# Patient Record
Sex: Male | Born: 1980
Health system: Southern US, Community
[De-identification: ages and names within clinical notes are randomized; demographics above are authoritative.]

## PROBLEM LIST (undated history)

## (undated) HISTORY — PX: VARICOSE VEIN SURGERY: SHX832

---

## 2006-06-28 ENCOUNTER — Emergency Department: Payer: Self-pay | Admitting: Emergency Medicine

## 2010-06-01 ENCOUNTER — Ambulatory Visit: Payer: Self-pay | Admitting: Internal Medicine

## 2010-06-08 ENCOUNTER — Ambulatory Visit: Payer: Self-pay | Admitting: Internal Medicine

## 2011-03-22 ENCOUNTER — Ambulatory Visit: Payer: Self-pay

## 2013-05-30 DIAGNOSIS — N469 Male infertility, unspecified: Secondary | ICD-10-CM | POA: Insufficient documentation

## 2015-01-09 ENCOUNTER — Ambulatory Visit (INDEPENDENT_AMBULATORY_CARE_PROVIDER_SITE_OTHER): Payer: Commercial Indemnity | Admitting: Family Medicine

## 2015-01-09 ENCOUNTER — Encounter: Payer: Self-pay | Admitting: Family Medicine

## 2015-01-09 VITALS — BP 120/80 | HR 80 | Resp 12 | Ht 76.0 in

## 2015-01-09 DIAGNOSIS — J0100 Acute maxillary sinusitis, unspecified: Secondary | ICD-10-CM

## 2015-01-09 DIAGNOSIS — H6983 Other specified disorders of Eustachian tube, bilateral: Secondary | ICD-10-CM

## 2015-01-09 MED ORDER — GUAIFENESIN-CODEINE 100-10 MG/5ML PO SOLN: 5.0000 mL | Freq: Three times a day (TID) | ORAL | Status: DC | PRN

## 2015-01-09 MED ORDER — AMOXICILLIN 500 MG PO CAPS: 500.0000 mg | ORAL_CAPSULE | Freq: Three times a day (TID) | ORAL | Status: DC

## 2015-01-09 NOTE — Progress Notes (Signed)
Name: Juan Gregory   MRN: [id number]    DOB: [birth year]   Date:01/09/2015       Progress Note  Subjective  Chief Complaint  Chief Complaint  Patient presents with  . Sinusitis    cough and cong- drainage in back of throat making him cough especially in am- productive cough    Sinusitis This is a new problem. The current episode started in the past 7 days. The problem has been gradually worsening since onset. There has been no fever. His pain is at a severity of 3/10. The pain is mild. Associated symptoms include congestion, coughing, diaphoresis, ear pain, headaches, sinus pressure, a sore throat and swollen glands. Pertinent negatives include no chills, hoarse voice, neck pain, shortness of breath or sneezing. Past treatments include oral decongestants. The treatment provided no relief.    No problem-specific assessment & plan notes found for this encounter.   No past medical history on file.  Past Surgical History  Procedure Laterality Date  . Varicose vein surgery Bilateral     No family history on file.  Social History   Social History  . Marital Status: Married    Spouse Name: N/A  . Number of Children: N/A  . Years of Education: N/A   Occupational History  . Not on file.   Social History Main Topics  . Smoking status: Never Smoker   . Smokeless tobacco: Current User    Types: Snuff  . Alcohol Use: 0.0 oz/week    0 Standard drinks or equivalent per week  . Drug Use: No  . Sexual Activity: Not on file   Other Topics Concern  . Not on file   Social History Narrative  . No narrative on file    No Known Allergies   Review of Systems  Constitutional: Positive for diaphoresis. Negative for fever, chills, weight loss and malaise/fatigue.  HENT: Positive for congestion, ear pain, sinus pressure and sore throat. Negative for ear discharge, hoarse voice and sneezing.   Eyes: Negative for blurred vision.  Respiratory: Positive for cough. Negative for  sputum production, shortness of breath and wheezing.   Cardiovascular: Negative for chest pain, palpitations and leg swelling.  Gastrointestinal: Negative for heartburn, nausea, abdominal pain, diarrhea, constipation, blood in stool and melena.  Genitourinary: Negative for dysuria, urgency, frequency and hematuria.  Musculoskeletal: Negative for myalgias, back pain, joint pain and neck pain.  Skin: Negative for rash.  Neurological: Positive for headaches. Negative for dizziness, tingling, sensory change and focal weakness.  Endo/Heme/Allergies: Negative for environmental allergies and polydipsia. Does not bruise/bleed easily.  Psychiatric/Behavioral: Negative for depression and suicidal ideas. The patient is not nervous/anxious and does not have insomnia.      Objective  Filed Vitals:   01/09/15 1409  BP: 120/80  Pulse: 80  Resp: 12  Height: 6\' 4"  (1.93 m)    Physical Exam  Constitutional: He is oriented to person, place, and time and well-developed, well-nourished, and in no distress.  HENT:  Head: Normocephalic.  Right Ear: External ear normal. Tympanic membrane is retracted.  Left Ear: External ear normal. Tympanic membrane is retracted.  Nose: Nose normal.  Mouth/Throat: Posterior oropharyngeal erythema present.  Eyes: Conjunctivae and EOM are normal. Pupils are equal, round, and reactive to light. Right eye exhibits no discharge. Left eye exhibits no discharge. No scleral icterus.  Neck: Normal range of motion. Neck supple. No JVD present. No tracheal deviation present. No thyromegaly present.  Cardiovascular: Normal rate, regular rhythm, normal heart sounds  and intact distal pulses.  Exam reveals no gallop and no friction rub.   No murmur heard. Pulmonary/Chest: Breath sounds normal. No respiratory distress. He has no wheezes. He has no rales.  Abdominal: Soft. Bowel sounds are normal. He exhibits no mass. There is no hepatosplenomegaly. There is no tenderness. There is no  rebound, no guarding and no CVA tenderness.  Musculoskeletal: Normal range of motion. He exhibits no edema or tenderness.  Lymphadenopathy:       Head (right side): Submandibular adenopathy present.       Head (left side): Submandibular adenopathy present.    He has no cervical adenopathy.  Neurological: He is alert and oriented to person, place, and time. He has normal sensation, normal strength, normal reflexes and intact cranial nerves. No cranial nerve deficit.  Skin: Skin is warm. No rash noted.  Psychiatric: Mood and affect normal.      Assessment & Plan  Problem List Items Addressed This Visit    None    Visit Diagnoses    Acute maxillary sinusitis, recurrence not specified    -  Primary    Relevant Medications    amoxicillin (AMOXIL) 500 MG capsule    guaiFENesin-codeine 100-10 MG/5ML syrup    Eustachian tube dysfunction, bilateral        decongestant/steroid nasal         Dr. [facility]  01/09/2015

## 2016-04-03 ENCOUNTER — Emergency Department
Admission: EM | Admit: 2016-04-03 | Discharge: 2016-04-03 | Disposition: A | Discharge status: Home / Self Care | Payer: Managed Care, Other (non HMO) | Attending: Emergency Medicine | Admitting: Emergency Medicine

## 2016-04-03 ENCOUNTER — Encounter: Payer: Self-pay | Admitting: Emergency Medicine

## 2016-04-03 DIAGNOSIS — J069 Acute upper respiratory infection, unspecified: Secondary | ICD-10-CM | POA: Diagnosis not present

## 2016-04-03 DIAGNOSIS — B9789 Other viral agents as the cause of diseases classified elsewhere: Secondary | ICD-10-CM

## 2016-04-03 DIAGNOSIS — Z79899 Other long term (current) drug therapy: Secondary | ICD-10-CM | POA: Diagnosis not present

## 2016-04-03 DIAGNOSIS — F17290 Nicotine dependence, other tobacco product, uncomplicated: Secondary | ICD-10-CM | POA: Insufficient documentation

## 2016-04-03 DIAGNOSIS — R05 Cough: Secondary | ICD-10-CM | POA: Diagnosis present

## 2016-04-03 LAB — INFLUENZA PANEL BY PCR (TYPE A & B)
INFLAPCR: NEGATIVE
Influenza B By PCR: NEGATIVE

## 2016-04-03 MED ORDER — FLUTICASONE PROPIONATE 50 MCG/ACT NA SUSP: 2.0000 | Freq: Every day | NASAL | 0 refills | Status: DC

## 2016-04-03 MED ORDER — PSEUDOEPH-BROMPHEN-DM 30-2-10 MG/5ML PO SYRP: 10.0000 mL | ORAL_SOLUTION | Freq: Four times a day (QID) | ORAL | 0 refills | Status: DC | PRN

## 2016-04-03 NOTE — ED Provider Notes (Signed)
[facility] Emergency Department Provider Note  ____________________________________________  Time seen: Approximately 5:41 PM  I have reviewed the triage vital signs and the nursing notes.   HISTORY  Chief Complaint wants flu test    HPI Juan Gregory is a 36 y.o. male , NAD, presents to the emergency department with2 day history of cough, chest congestion and body aches. States he has had fever all with the highest temperature being 65F. Has taken some Mucinex without significant relief of symptoms. Denies any sick contacts. Has had no sinus pressure, ear pain, ear pressure, sore throat, drainage from ears, nasal congestion, runny nose. Denies shortness breath, wheezing. Has had no abdominal pain, nausea, vomiting or diarrhea.   History reviewed. No pertinent past medical history.  There are no active problems to display for this patient.   Past Surgical History:  Procedure Laterality Date  . VARICOSE VEIN SURGERY Bilateral     Prior to Admission medications   Medication Sig Start Date End Date Taking? Authorizing Provider  amoxicillin (AMOXIL) 500 MG capsule Take 1 capsule (500 mg total) by mouth 3 (three) times daily. 01/09/15   Juline Patch, MD  brompheniramine-pseudoephedrine-DM 30-2-10 MG/5ML syrup Take 10 mLs by mouth 4 (four) times daily as needed. 04/03/16   Destinee Taber L Emaree Chiu, PA-C  fluticasone (FLONASE) 50 MCG/ACT nasal spray Place 2 sprays into both nostrils daily. 04/03/16   Tamea Bai L Airik Goodlin, PA-C  guaiFENesin-codeine 100-10 MG/5ML syrup Take 5 mLs by mouth 3 (three) times daily as needed. 01/09/15   Juline Patch, MD    Allergies Patient has no known allergies.  History reviewed. No pertinent family history.  Social History Social History  Substance Use Topics  . Smoking status: Never Smoker  . Smokeless tobacco: Current User    Types: Snuff  . Alcohol use 0.0 oz/week     Review of Systems  Constitutional: Positive fever with  MAXIMUM TEMPERATURE of 65F and fatigue. No chills, rigors ENT: No sore throat, Nasal congestion, runny nose, ear pain, ear pressure, sinus pressure. Cardiovascular: No chest pain. Respiratory: Positive cough, chest congestion. No shortness of breath. No wheezing.  Gastrointestinal: No abdominal pain.  No nausea, vomiting.  No diarrhea.   Musculoskeletal: Positive for general myalgias.  Skin: Negative for rash. Neurological: Negative for headaches. 10-point ROS otherwise negative.  ____________________________________________   PHYSICAL EXAM:  VITAL SIGNS: ED Triage Vitals [04/03/16 1630]  Enc Vitals Group     BP 123/67     Pulse Rate (!) 112     Resp 18     Temp 98.8 F (37.1 C)     Temp Source Oral     SpO2 97 %     Weight 235 lb (106.6 kg)     Height 6\' 4"  (1.93 m)     Head Circumference      Peak Flow      Pain Score 7     Pain Loc      Pain Edu?      Excl. in [place]?      Constitutional: Alert and oriented. Well appearing and in no acute distress. Eyes: Conjunctivae are normal.  Head: Atraumatic. ENT:      Ears: TMs visualized bilaterally with out effusion, bulging, erythema, perforation.      Nose: Mild congestion with trace clear rhinorrhea. Bilateral turbinates are injected.      Mouth/Throat: Mucous membranes are moist. Nares without erythema, swelling, exudate. Uvula is midline. Airways pain. Clear postnasal drip. Neck: Upper  with full range of motion. Hematological/Lymphatic/Immunilogical: No cervical lymphadenopathy. Cardiovascular: Normal rate, regular rhythm. Normal S1 and S2.  Good peripheral circulation. Respiratory: Normal respiratory effort without tachypnea or retractions. Lungs CTAB with breath sounds noted in all lung fields. No wheeze, rhonchi, rales Neurologic:  Normal speech and language. No gross focal neurologic deficits are appreciated.  Skin:  Skin is warm, dry and intact. No rash noted. Psychiatric: Mood and affect are normal. Speech and  behavior are normal. Patient exhibits appropriate insight and judgement.   ____________________________________________   LABS (all labs ordered are listed, but only abnormal results are displayed)  Labs Reviewed  INFLUENZA PANEL BY PCR (TYPE A & B)   ____________________________________________  EKG  None ____________________________________________  RADIOLOGY  None ____________________________________________    PROCEDURES  Procedure(s) performed: None   Procedures   Medications - No data to display   ____________________________________________   INITIAL IMPRESSION / ASSESSMENT AND PLAN / ED COURSE  Pertinent labs & imaging results that were available during my care of the patient were reviewed by me and considered in my medical decision making (see chart for details).     Patient's diagnosis is consistent with viral URI with cough. Patient will be discharged home with prescriptions for Bromfed-DM, Flonase to take as directed. Patient is to follow up with his primary care provider if symptoms persist past this treatment course. Patient is given ED precautions to return to the ED for any worsening or new symptoms.    ____________________________________________  FINAL CLINICAL IMPRESSION(S) / ED DIAGNOSES  Final diagnoses:  Viral URI with cough      NEW MEDICATIONS STARTED DURING THIS VISIT:  Discharge Medication List as of 04/03/2016  6:42 PM    START taking these medications   Details  brompheniramine-pseudoephedrine-DM 30-2-10 MG/5ML syrup Take 10 mLs by mouth 4 (four) times daily as needed., Starting Thu 04/03/2016, Print    fluticasone (FLONASE) 50 MCG/ACT nasal spray Place 2 sprays into both nostrils daily., Starting Thu 04/03/2016, [place], PA-C 04/03/16 [place], MD 04/03/16 2007

## 2016-04-03 NOTE — ED Notes (Signed)
States cough, congestion, fever for several days, states he wants to be tested for the flu, awake and alert in no acute distress

## 2016-04-03 NOTE — ED Triage Notes (Signed)
Pt c/o body aches, congestion, and cough since yesterday. Wants to be checked for the flu, unsure if exposed. Highest temp 99.6. No distress, ambulatory to triage.

## 2016-06-23 ENCOUNTER — Encounter: Payer: Self-pay | Admitting: Family Medicine

## 2016-06-23 ENCOUNTER — Ambulatory Visit (INDEPENDENT_AMBULATORY_CARE_PROVIDER_SITE_OTHER): Payer: Managed Care, Other (non HMO) | Admitting: Family Medicine

## 2016-06-23 VITALS — BP 120/70 | HR 80 | Ht 76.0 in | Wt 234.0 lb

## 2016-06-23 DIAGNOSIS — J0100 Acute maxillary sinusitis, unspecified: Secondary | ICD-10-CM | POA: Diagnosis not present

## 2016-06-23 MED ORDER — AMOXICILLIN 500 MG PO CAPS: 500.0000 mg | ORAL_CAPSULE | Freq: Three times a day (TID) | ORAL | 1 refills | Status: DC

## 2016-06-23 MED ORDER — GUAIFENESIN-CODEINE 100-10 MG/5ML PO SYRP: 5.0000 mL | ORAL_SOLUTION | Freq: Three times a day (TID) | ORAL | 0 refills | Status: DC | PRN

## 2016-06-23 NOTE — Progress Notes (Signed)
Name: Juan Gregory   MRN: [id number]    DOB: 06-30-1980   Date:06/23/2016       Progress Note  Subjective  Chief Complaint  Chief Complaint  Patient presents with  . Sinusitis    cough with small amount of clear mucus. Feels like ear is irritated    Sinusitis  This is a new problem. The current episode started in the past 7 days. The problem has been gradually worsening since onset. There has been no fever. Associated symptoms include congestion, coughing, ear pain, sinus pressure and sneezing. Pertinent negatives include no chills, diaphoresis, headaches, hoarse voice, neck pain, shortness of breath, sore throat or swollen glands. Past treatments include oral decongestants. The treatment provided no relief (mucinex dm).    No problem-specific Assessment & Plan notes found for this encounter.   No past medical history on file.  Past Surgical History:  Procedure Laterality Date  . VARICOSE VEIN SURGERY Bilateral     No family history on file.  Social History   Social History  . Marital status: Married    Spouse name: N/A  . Number of children: N/A  . Years of education: N/A   Occupational History  . Not on file.   Social History Main Topics  . Smoking status: Never Smoker  . Smokeless tobacco: Current User    Types: Snuff  . Alcohol use 0.0 oz/week  . Drug use: No  . Sexual activity: Not on file   Other Topics Concern  . Not on file   Social History Narrative  . No narrative on file    No Known Allergies  Outpatient Medications Prior to Visit  Medication Sig Dispense Refill  . fluticasone (FLONASE) 50 MCG/ACT nasal spray Place 2 sprays into both nostrils daily. 16 g 0  . amoxicillin (AMOXIL) 500 MG capsule Take 1 capsule (500 mg total) by mouth 3 (three) times daily. 30 capsule 0  . brompheniramine-pseudoephedrine-DM 30-2-10 MG/5ML syrup Take 10 mLs by mouth 4 (four) times daily as needed. 200 mL 0  . guaiFENesin-codeine 100-10 MG/5ML syrup Take 5 mLs by  mouth 3 (three) times daily as needed. 120 mL 0   No facility-administered medications prior to visit.     Review of Systems  Constitutional: Negative for chills, diaphoresis, fever, malaise/fatigue and weight loss.  HENT: Positive for congestion, ear pain, sinus pressure and sneezing. Negative for ear discharge, hoarse voice and sore throat.   Eyes: Negative for blurred vision.  Respiratory: Positive for cough. Negative for sputum production, shortness of breath and wheezing.   Cardiovascular: Negative for chest pain, palpitations and leg swelling.  Gastrointestinal: Negative for abdominal pain, blood in stool, constipation, diarrhea, heartburn, melena and nausea.  Genitourinary: Negative for dysuria, frequency, hematuria and urgency.  Musculoskeletal: Negative for back pain, joint pain, myalgias and neck pain.  Skin: Negative for rash.  Neurological: Negative for dizziness, tingling, sensory change, focal weakness and headaches.  Endo/Heme/Allergies: Negative for environmental allergies and polydipsia. Does not bruise/bleed easily.  Psychiatric/Behavioral: Negative for depression and suicidal ideas. The patient is not nervous/anxious and does not have insomnia.      Objective  Vitals:   06/23/16 1502  BP: 120/70  Pulse: 80  Weight: 234 lb (106.1 kg)  Height: 6\' 4"  (1.93 m)    Physical Exam  Constitutional: He is oriented to person, place, and time and well-developed, well-nourished, and in no distress.  HENT:  Head: Normocephalic.  Right Ear: External ear normal.  Left Ear: External ear  normal.  Nose: Nose normal.  Mouth/Throat: Oropharynx is clear and moist.  Eyes: Conjunctivae and EOM are normal. Pupils are equal, round, and reactive to light. Right eye exhibits no discharge. Left eye exhibits no discharge. No scleral icterus.  Neck: Normal range of motion. Neck supple. No JVD present. No tracheal deviation present. No thyromegaly present.  Cardiovascular: Normal rate,  regular rhythm, normal heart sounds and intact distal pulses.  Exam reveals no gallop and no friction rub.   No murmur heard. Pulmonary/Chest: Breath sounds normal. No respiratory distress. He has no wheezes. He has no rales.  Abdominal: Soft. Bowel sounds are normal. He exhibits no mass. There is no hepatosplenomegaly. There is no tenderness. There is no rebound, no guarding and no CVA tenderness.  Musculoskeletal: Normal range of motion. He exhibits no edema or tenderness.  Lymphadenopathy:    He has no cervical adenopathy.  Neurological: He is alert and oriented to person, place, and time. He has normal sensation, normal strength, normal reflexes and intact cranial nerves. No cranial nerve deficit.  Skin: Skin is warm. No rash noted.  Psychiatric: Mood and affect normal.  Nursing note and vitals reviewed.     Assessment & Plan  Problem List Items Addressed This Visit    None    Visit Diagnoses    Acute maxillary sinusitis, recurrence not specified    -  Primary   Relevant Medications   amoxicillin (AMOXIL) 500 MG capsule   guaiFENesin-codeine (ROBITUSSIN AC) 100-10 MG/5ML syrup      Meds ordered this encounter  Medications  . amoxicillin (AMOXIL) 500 MG capsule    Sig: Take 1 capsule (500 mg total) by mouth 3 (three) times daily.    Dispense:  30 capsule    Refill:  1  . guaiFENesin-codeine (ROBITUSSIN AC) 100-10 MG/5ML syrup    Sig: Take 5 mLs by mouth 3 (three) times daily as needed for cough.    Dispense:  150 mL    Refill:  0      Dr. Aquiles [facility]  06/23/16

## 2016-07-04 ENCOUNTER — Ambulatory Visit (INDEPENDENT_AMBULATORY_CARE_PROVIDER_SITE_OTHER): Payer: Managed Care, Other (non HMO) | Admitting: Family Medicine

## 2016-07-04 ENCOUNTER — Encounter: Payer: Self-pay | Admitting: Family Medicine

## 2016-07-04 VITALS — BP 130/70 | HR 78 | Ht 76.0 in | Wt 231.0 lb

## 2016-07-04 DIAGNOSIS — J301 Allergic rhinitis due to pollen: Secondary | ICD-10-CM

## 2016-07-04 DIAGNOSIS — Z23 Encounter for immunization: Secondary | ICD-10-CM

## 2016-07-04 DIAGNOSIS — Z0000 Encounter for general adult medical examination without abnormal findings: Secondary | ICD-10-CM

## 2016-07-04 LAB — POCT URINALYSIS DIPSTICK
Bilirubin, UA: NEGATIVE
Blood, UA: NEGATIVE
GLUCOSE UA: NEGATIVE
KETONES UA: NEGATIVE
LEUKOCYTES UA: NEGATIVE
Nitrite, UA: NEGATIVE
PROTEIN UA: NEGATIVE
Spec Grav, UA: 1.01 (ref 1.010–1.025)
UROBILINOGEN UA: 0.2 U/dL
pH, UA: 5 (ref 5.0–8.0)

## 2016-07-04 MED ORDER — FLUTICASONE PROPIONATE 50 MCG/ACT NA SUSP: 2.0000 | Freq: Every day | NASAL | 11 refills | Status: DC

## 2016-07-04 MED ORDER — FEXOFENADINE-PSEUDOEPHED ER 180-240 MG PO TB24: 1.0000 | ORAL_TABLET | Freq: Every day | ORAL | 11 refills | Status: DC

## 2016-07-04 NOTE — Progress Notes (Signed)
Name: Juan Gregory   MRN: [id number]    DOB: [birth year]   Date:07/04/2016       Progress Note  Subjective  Chief Complaint  Chief Complaint  Patient presents with  . Annual Exam    no issues or concerns    Patient presents for annual physical exam.    No problem-specific Assessment & Plan notes found for this encounter.   No past medical history on file.  Past Surgical History:  Procedure Laterality Date  . VARICOSE VEIN SURGERY Bilateral     No family history on file.  Social History   Social History  . Marital status: Married    Spouse name: N/A  . Number of children: N/A  . Years of education: N/A   Occupational History  . Not on file.   Social History Main Topics  . Smoking status: Never Smoker  . Smokeless tobacco: Current User    Types: Snuff  . Alcohol use 0.0 oz/week  . Drug use: No  . Sexual activity: Not on file   Other Topics Concern  . Not on file   Social History Narrative  . No narrative on file    No Known Allergies  Outpatient Medications Prior to Visit  Medication Sig Dispense Refill  . guaiFENesin-codeine (ROBITUSSIN AC) 100-10 MG/5ML syrup Take 5 mLs by mouth 3 (three) times daily as needed for cough. 150 mL 0  . fluticasone (FLONASE) 50 MCG/ACT nasal spray Place 2 sprays into both nostrils daily. 16 g 0  . amoxicillin (AMOXIL) 500 MG capsule Take 1 capsule (500 mg total) by mouth 3 (three) times daily. 30 capsule 1   No facility-administered medications prior to visit.     Review of Systems  Constitutional: Negative for chills, fever, malaise/fatigue and weight loss.  HENT: Negative for ear discharge, ear pain and sore throat.   Eyes: Negative for blurred vision.  Respiratory: Negative for cough, sputum production, shortness of breath and wheezing.   Cardiovascular: Negative for chest pain, palpitations and leg swelling.  Gastrointestinal: Negative for abdominal pain, blood in stool, constipation, diarrhea, heartburn,  melena and nausea.  Genitourinary: Negative for dysuria, frequency, hematuria and urgency.  Musculoskeletal: Negative for back pain, joint pain, myalgias and neck pain.  Skin: Negative for rash.  Neurological: Negative for dizziness, tingling, sensory change, focal weakness and headaches.  Endo/Heme/Allergies: Negative for environmental allergies and polydipsia. Does not bruise/bleed easily.  Psychiatric/Behavioral: Negative for depression and suicidal ideas. The patient is not nervous/anxious and does not have insomnia.      Objective  Vitals:   07/04/16 0828  BP: 130/70  Pulse: 78  Weight: 231 lb (104.8 kg)  Height: 6\' 4"  (1.93 m)    Physical Exam  Constitutional: He is oriented to person, place, and time and well-developed, well-nourished, and in no distress.  HENT:  Head: Normocephalic.  Right Ear: Tympanic membrane, external ear and ear canal normal.  Left Ear: Tympanic membrane, external ear and ear canal normal.  Nose: Nose normal.  Mouth/Throat: Uvula is midline, oropharynx is clear and moist and mucous membranes are normal.  Eyes: Conjunctivae, EOM and lids are normal. Pupils are equal, round, and reactive to light. Right eye exhibits no discharge. Left eye exhibits no discharge. No scleral icterus.  Neck: Normal range of motion. Neck supple. Normal carotid pulses, no hepatojugular reflux and no JVD present. Carotid bruit is not present. No tracheal deviation present. No thyromegaly present.  Cardiovascular: Normal rate, regular rhythm, S1 normal, S2 normal, normal  heart sounds, intact distal pulses and normal pulses.  PMI is not displaced.  Exam reveals no gallop, no S3, no S4, no friction rub and no decreased pulses.   No murmur heard. Pulmonary/Chest: Effort normal and breath sounds normal. No respiratory distress. He has no wheezes. He has no rales. Right breast exhibits no mass. Left breast exhibits no mass.  Abdominal: Soft. Normal aorta and bowel sounds are normal. He  exhibits no mass. There is no hepatosplenomegaly, splenomegaly or hepatomegaly. There is no tenderness. There is no rebound, no guarding and no CVA tenderness.  Genitourinary: Prostate normal, testes/scrotum normal and penis normal. Rectal exam shows no external hemorrhoid and guaiac negative stool.  Musculoskeletal: Normal range of motion. He exhibits no edema or tenderness.       Cervical back: Normal.       Thoracic back: Normal.       Lumbar back: Normal.  Lymphadenopathy:       Head (right side): No submental and no submandibular adenopathy present.       Head (left side): No submental and no submandibular adenopathy present.    He has no cervical adenopathy.    He has no axillary adenopathy.  Neurological: He is alert and oriented to person, place, and time. He has normal sensation, normal strength, normal reflexes and intact cranial nerves. No cranial nerve deficit.  Skin: Skin is warm and intact. No rash noted. He is not diaphoretic. No pallor.  Psychiatric: Mood, memory, affect and judgment normal.  Nursing note and vitals reviewed.     Assessment & Plan  Problem List Items Addressed This Visit    None    Visit Diagnoses    Annual physical exam    -  Primary   Relevant Orders   Renal Function Panel   Lipid Profile   Seasonal allergic rhinitis due to pollen       Relevant Medications   fexofenadine-pseudoephedrine (ALLEGRA-D 24) 180-240 MG 24 hr tablet   fluticasone (FLONASE) 50 MCG/ACT nasal spray   Need for diphtheria-tetanus-pertussis (Tdap) vaccine       Relevant Orders   POCT urinalysis dipstick (Completed)   Tdap vaccine greater than or equal to 7yo IM (Completed)      Meds ordered this encounter  Medications  . fexofenadine-pseudoephedrine (ALLEGRA-D 24) 180-240 MG 24 hr tablet    Sig: Take 1 tablet by mouth daily.    Dispense:  30 tablet    Refill:  11  . fluticasone (FLONASE) 50 MCG/ACT nasal spray    Sig: Place 2 sprays into both nostrils daily.     Dispense:  16 g    Refill:  11      Dr. [facility]  07/04/16

## 2016-07-08 ENCOUNTER — Other Ambulatory Visit (INDEPENDENT_AMBULATORY_CARE_PROVIDER_SITE_OTHER): Payer: Managed Care, Other (non HMO)

## 2016-07-08 DIAGNOSIS — Z1211 Encounter for screening for malignant neoplasm of colon: Secondary | ICD-10-CM | POA: Diagnosis not present

## 2016-07-08 LAB — HEMOCCULT GUIAC POC 1CARD (OFFICE)
Card #2 Fecal Occult Blod, POC: NEGATIVE
FECAL OCCULT BLD: NEGATIVE

## 2017-11-24 ENCOUNTER — Emergency Department: Payer: Managed Care, Other (non HMO)

## 2017-11-24 ENCOUNTER — Other Ambulatory Visit: Payer: Self-pay

## 2017-11-24 ENCOUNTER — Emergency Department
Admission: EM | Admit: 2017-11-24 | Discharge: 2017-11-24 | Disposition: A | Discharge status: Home / Self Care | Payer: Managed Care, Other (non HMO) | Attending: Emergency Medicine | Admitting: Emergency Medicine

## 2017-11-24 DIAGNOSIS — R109 Unspecified abdominal pain: Secondary | ICD-10-CM | POA: Diagnosis present

## 2017-11-24 DIAGNOSIS — N201 Calculus of ureter: Secondary | ICD-10-CM | POA: Diagnosis not present

## 2017-11-24 DIAGNOSIS — R300 Dysuria: Secondary | ICD-10-CM | POA: Diagnosis not present

## 2017-11-24 DIAGNOSIS — N23 Unspecified renal colic: Secondary | ICD-10-CM | POA: Diagnosis not present

## 2017-11-24 DIAGNOSIS — R112 Nausea with vomiting, unspecified: Secondary | ICD-10-CM | POA: Insufficient documentation

## 2017-11-24 LAB — CBC
HEMATOCRIT: 42.2 % (ref 40.0–52.0)
Hemoglobin: 15 g/dL (ref 13.0–18.0)
MCH: 34.3 pg — ABNORMAL HIGH (ref 26.0–34.0)
MCHC: 35.4 g/dL (ref 32.0–36.0)
MCV: 96.8 fL (ref 80.0–100.0)
Platelets: 157 10*3/uL (ref 150–440)
RBC: 4.36 MIL/uL — AB (ref 4.40–5.90)
RDW: 12.6 % (ref 11.5–14.5)
WBC: 12.5 10*3/uL — ABNORMAL HIGH (ref 3.8–10.6)

## 2017-11-24 LAB — COMPREHENSIVE METABOLIC PANEL
ALT: 26 U/L (ref 0–44)
AST: 27 U/L (ref 15–41)
Albumin: 4.4 g/dL (ref 3.5–5.0)
Alkaline Phosphatase: 51 U/L (ref 38–126)
Anion gap: 8 (ref 5–15)
BILIRUBIN TOTAL: 0.6 mg/dL (ref 0.3–1.2)
BUN: 14 mg/dL (ref 6–20)
CO2: 25 mmol/L (ref 22–32)
Calcium: 8.9 mg/dL (ref 8.9–10.3)
Chloride: 106 mmol/L (ref 98–111)
Creatinine, Ser: 1.05 mg/dL (ref 0.61–1.24)
Glucose, Bld: 156 mg/dL — ABNORMAL HIGH (ref 70–99)
POTASSIUM: 3.7 mmol/L (ref 3.5–5.1)
Sodium: 139 mmol/L (ref 135–145)
TOTAL PROTEIN: 7.1 g/dL (ref 6.5–8.1)

## 2017-11-24 LAB — URINALYSIS, COMPLETE (UACMP) WITH MICROSCOPIC
BACTERIA UA: NONE SEEN
BILIRUBIN URINE: NEGATIVE
Glucose, UA: NEGATIVE mg/dL
Ketones, ur: NEGATIVE mg/dL
LEUKOCYTES UA: NEGATIVE
NITRITE: NEGATIVE
Protein, ur: 30 mg/dL — AB
RBC / HPF: >50 RBC/hpf — ABNORMAL HIGH (ref 0–5)
SPECIFIC GRAVITY, URINE: 1.017 (ref 1.005–1.030)
Squamous Epithelial / LPF: NONE SEEN (ref 0–5)
pH: 7 (ref 5.0–8.0)

## 2017-11-24 LAB — LIPASE, BLOOD: Lipase: 104 U/L — ABNORMAL HIGH (ref 11–51)

## 2017-11-24 MED ORDER — OXYCODONE-ACETAMINOPHEN 5-325 MG PO TABS: 1.0000 | ORAL_TABLET | Freq: Four times a day (QID) | ORAL | 0 refills | Status: DC | PRN

## 2017-11-24 MED ORDER — TAMSULOSIN HCL 0.4 MG PO CAPS: ORAL_CAPSULE | ORAL | 0 refills | Status: DC

## 2017-11-24 MED ORDER — KETOROLAC TROMETHAMINE 30 MG/ML IJ SOLN
15.0000 mg | Freq: Once | INTRAMUSCULAR | Status: AC
  Administered 2017-11-24: 15 mg via INTRAVENOUS
  Filled 2017-11-24: qty 1

## 2017-11-24 MED ORDER — MORPHINE SULFATE (PF) 4 MG/ML IV SOLN
4.0000 mg | Freq: Once | INTRAVENOUS | Status: AC
  Administered 2017-11-24: 4 mg via INTRAVENOUS
  Filled 2017-11-24: qty 1

## 2017-11-24 MED ORDER — SODIUM CHLORIDE 0.9 % IV BOLUS
1000.0000 mL | Freq: Once | INTRAVENOUS | Status: AC
  Administered 2017-11-24: 1000 mL via INTRAVENOUS

## 2017-11-24 MED ORDER — DOCUSATE SODIUM 100 MG PO CAPS: ORAL_CAPSULE | ORAL | 0 refills | Status: DC

## 2017-11-24 MED ORDER — ONDANSETRON HCL 4 MG/2ML IJ SOLN
4.0000 mg | INTRAMUSCULAR | Status: AC
  Administered 2017-11-24: 4 mg via INTRAVENOUS
  Filled 2017-11-24: qty 2

## 2017-11-24 MED ORDER — ONDANSETRON 4 MG PO TBDP: ORAL_TABLET | ORAL | 0 refills | Status: DC

## 2017-11-24 NOTE — ED Provider Notes (Signed)
[facility] Emergency Department Provider Note  ____________________________________________   First MD Initiated Contact with Patient 11/24/17 [phone]     (approximate)  I have reviewed the triage vital signs and the nursing notes.   HISTORY  Chief Complaint Abdominal Pain and Dysuria    HPI Juan Gregory is a 37 y.o. male with no chronic medical issues who presents for evaluation of acute onset left-sided abdominal and flank pain as well as burning urination and having a difficult time urinating.  The symptoms started about 2 hours prior to arrival to the ED. they were severe and nothing in particular made them better or worse although he is feeling a little bit better now.  He has no history of kidney stones.  He denies fever/chills, chest pain or shortness of breath.  He has had multiple episodes of vomiting and persistent nausea.  No past medical history on file.  There are no active problems to display for this patient.   Past Surgical History:  Procedure Laterality Date  . VARICOSE VEIN SURGERY Bilateral     Prior to Admission medications   Medication Sig Start Date End Date Taking? Authorizing Provider  docusate sodium (COLACE) 100 MG capsule Take 1 tablet once or twice daily as needed for constipation while taking narcotic pain medicine 11/24/17   Hinda Kehr, MD  fexofenadine-pseudoephedrine (ALLEGRA-D 24) 180-240 MG 24 hr tablet Take 1 tablet by mouth daily. 07/04/16   Juline Patch, MD  fluticasone (FLONASE) 50 MCG/ACT nasal spray Place 2 sprays into both nostrils daily. 07/04/16   Juline Patch, MD  guaiFENesin-codeine (ROBITUSSIN AC) 100-10 MG/5ML syrup Take 5 mLs by mouth 3 (three) times daily as needed for cough. 06/23/16   Juline Patch, MD  ondansetron (ZOFRAN ODT) 4 MG disintegrating tablet Allow 1-2 tablets to dissolve in your mouth every 8 hours as needed for nausea/vomiting 11/24/17   Hinda Kehr, MD  oxyCODONE-acetaminophen  (PERCOCET) 5-325 MG tablet Take 1-2 tablets by mouth every 6 (six) hours as needed for severe pain. 11/24/17   Hinda Kehr, MD  tamsulosin (FLOMAX) 0.4 MG CAPS capsule Take 1 tablet by mouth daily until you pass the kidney stone or no longer have symptoms 11/24/17   Hinda Kehr, MD    Allergies Patient has no known allergies.  No family history on file.  Social History Social History   Tobacco Use  . Smoking status: Never Smoker  . Smokeless tobacco: Current User    Types: Snuff  Substance Use Topics  . Alcohol use: Yes    Alcohol/week: 0.0 standard drinks  . Drug use: No    Review of Systems Constitutional: No fever/chills Eyes: No visual changes. ENT: No sore throat. Cardiovascular: Denies chest pain. Respiratory: Denies shortness of breath. Gastrointestinal: Abdominal pain, nausea, and vomiting as described above. Genitourinary: Dysuria and urinary hesitancy as described above Musculoskeletal: Negative for neck pain.  Left flank pain Integumentary: Negative for rash. Neurological: Negative for headaches, focal weakness or numbness.   ____________________________________________   PHYSICAL EXAM:  VITAL SIGNS: ED Triage Vitals  Enc Vitals Group     BP 11/24/17 0208 112/71     Pulse Rate 11/24/17 0208 (!) 59     Resp 11/24/17 0208 18     Temp 11/24/17 0208 (!) 97.5 F (36.4 C)     Temp Source 11/24/17 0208 Oral     SpO2 11/24/17 0208 97 %     Weight 11/24/17 0209 104.3 kg (230 lb)  Height 11/24/17 0209 1.93 m (6\' 4" )     Head Circumference --      Peak Flow --      Pain Score 11/24/17 0224 8     Pain Loc --      Pain Edu? --      Excl. in [place]? --     Constitutional: Alert and oriented. Well appearing and in no acute distress after morphine but was in severe distress before Eyes: Conjunctivae are normal.  Head: Atraumatic. Nose: No congestion/rhinnorhea. Mouth/Throat: Mucous membranes are moist. Neck: No stridor.  No meningeal signs.     Cardiovascular: Normal rate, regular rhythm. Good peripheral circulation. Grossly normal heart sounds. Respiratory: Normal respiratory effort.  No retractions. Lungs CTAB. Gastrointestinal: Soft and nontender. No distention.  Genitourinary: Normal-appearing circumcised male external genitalia.  No swelling, discoloration, nor tenderness in the left inguinal region in the area indicated as an abnormality on the CT scan.  Testicles are nontender to palpation and no scrotal edema. Musculoskeletal: No lower extremity tenderness nor edema. No gross deformities of extremities.  Mild left CVA tenderness to percussion. Neurologic:  Normal speech and language. No gross focal neurologic deficits are appreciated.  Skin:  Skin is warm, dry and intact. No rash noted. Psychiatric: Mood and affect are normal. Speech and behavior are normal.  ____________________________________________   LABS (all labs ordered are listed, but only abnormal results are displayed)  Labs Reviewed  LIPASE, BLOOD - Abnormal; Notable for the following components:      Result Value   Lipase 104 (*)    All other components within normal limits  COMPREHENSIVE METABOLIC PANEL - Abnormal; Notable for the following components:   Glucose, Bld 156 (*)    All other components within normal limits  CBC - Abnormal; Notable for the following components:   WBC 12.5 (*)    RBC 4.36 (*)    MCH 34.3 (*)    All other components within normal limits  URINALYSIS, COMPLETE (UACMP) WITH MICROSCOPIC - Abnormal; Notable for the following components:   Color, Urine YELLOW (*)    APPearance HAZY (*)    Hgb urine dipstick LARGE (*)    Protein, ur 30 (*)    RBC / HPF >50 (*)    All other components within normal limits   ____________________________________________  EKG  None - EKG not ordered by ED physician ____________________________________________  RADIOLOGY   ED MD interpretation: Obstructing left 3 mm UVJ stone.  Question of  vascular or other irregularity in the left inguinal region.  Official radiology report(s): Ct Renal Stone Study  Result Date: 11/24/2017 CLINICAL DATA:  Left flank pain and painful urination.  Vomiting. EXAM: CT ABDOMEN AND PELVIS WITHOUT CONTRAST TECHNIQUE: Multidetector CT imaging of the abdomen and pelvis was performed following the standard protocol without IV contrast. COMPARISON:  None. FINDINGS: Lower chest: Mild elevation of right hemidiaphragm. Dependent atelectasis in both lungs. No pleural fluid or consolidation. Hepatobiliary: Subcentimeter hypodensity in the right hepatic lobe, too small and incompletely characterized on noncontrast exam. Gallbladder physiologically distended, no calcified stone. No biliary dilatation. Pancreas: No ductal dilatation or inflammation. Spleen: Normal in size without focal abnormality. Adrenals/Urinary Tract: Normal adrenal glands. Obstructing 3 mm stone in the distal left ureter just proximal to the ureterovesicular junction with moderate left hydroureteronephrosis. Mild left perinephric edema. No right hydronephrosis or hydroureter. No right perinephric edema. Within the mid left kidney is an 18 mm lesion slightly hyperdense to adjacent renal parenchyma. Urinary bladder near completely empty.  No bladder wall thickening. Stomach/Bowel: Stomach is physiologically distended. No bowel wall thickening, inflammatory change or obstruction. Normal appendix. Slight fecalization of distal small bowel contents. Vascular/Lymphatic: Serpiginous vessels/soft tissue density in the left inguinal region appear contiguous with the left iliac vein. Mild stranding in the retroperitoneum is nonspecific. No enlarged abdominal or pelvic lymph nodes. Few multiple prominent central mesenteric and ileocolic nodes likely reactive. Reproductive: Prostate is unremarkable. Other: Fat in both inguinal canals. Small fat containing umbilical hernia. Tiny fat containing ventral abdominal wall  supraumbilical hernia. No ascites or free air. Musculoskeletal: Vacuum phenomena at L5-S1. There are no acute or suspicious osseous abnormalities. IMPRESSION: 1. Obstructing 3 mm stone in the distal left ureter just proximal to the ureterovesicular junction with moderate hydronephrosis. 2. Clustered serpiginous soft tissue density in the left inguinal region likely vascular, may be varicosities but are nonspecific and suboptimally assessed in the absence of IV contrast. Recommend correlation with physical exam. Electronically Signed   By: Keith Rake M.D.   On: 11/24/2017 03:24  With the morning  ____________________________________________   PROCEDURES  Critical Care performed: No   Procedure(s) performed:   Procedures   ____________________________________________   INITIAL IMPRESSION / ASSESSMENT AND PLAN / ED COURSE  As part of my medical decision making, I reviewed the following data within the [place] notes reviewed and incorporated, Labs reviewed  and Notes from prior ED visits    Differential diagnosis includes, but is not limited to, renal colic, UTI, infected/obstructive stone, and pyelonephritis, musculoskeletal pain.  Patient has some hematuria but no sign of urinary tract infection.  He has a leukocytosis which is likely reactive.  Vital signs are stable and appropriate.  Lipase is slightly elevated but this is likely the results of multiple episodes of vomiting given that the patient has no tenderness to palpation of his upper abdomen and no other signs or symptoms or concerns for pancreatitis.  Metabolic panel is within normal limits.  Patient is feeling much better after morphine 4 mg IV and Zofran 4 mg IV. Also gave him 1L NS IV bolus.  I had my usual customary kidney stone discussion with the patient and he understands and agrees with the plan for outpatient follow-up.  I provided medications as listed below.  He is in no acute distress at  the time of discharge.      ____________________________________________  FINAL CLINICAL IMPRESSION(S) / ED DIAGNOSES  Final diagnoses:  Left ureteral stone  Renal colic  Non-intractable vomiting with nausea, unspecified vomiting type     MEDICATIONS GIVEN DURING THIS VISIT:  Medications  morphine 4 MG/ML injection 4 mg (4 mg Intravenous Given 11/24/17 0324)  ondansetron (ZOFRAN) injection 4 mg (4 mg Intravenous Given 11/24/17 0324)  sodium chloride 0.9 % bolus 1,000 mL (0 mLs Intravenous Stopped 11/24/17 0431)  ketorolac (TORADOL) 30 MG/ML injection 15 mg (15 mg Intravenous Given 11/24/17 0356)     ED Discharge Orders         Ordered    oxyCODONE-acetaminophen (PERCOCET) 5-325 MG tablet  Every 6 hours PRN     11/24/17 0424    ondansetron (ZOFRAN ODT) 4 MG disintegrating tablet     11/24/17 0424    tamsulosin (FLOMAX) 0.4 MG CAPS capsule     11/24/17 0424    docusate sodium (COLACE) 100 MG capsule     11/24/17 0424           Note:  This document was prepared using Dragon voice recognition  software and may include unintentional dictation errors.    Hinda Kehr, MD 11/24/17 [phone]

## 2017-11-24 NOTE — ED Notes (Signed)
Pt reports left lower abdominal pain that started around midnight. Pt also states vomiting twice at home and twice here in ED. Pt is alert and oriented x 4. Family at bedside.

## 2017-11-24 NOTE — Discharge Instructions (Signed)
You have been seen in the Emergency Department (ED) today for pain that we believe based on your workup, is caused by kidney stones.  As we have discussed, please drink plenty of fluids.  Please make a follow up appointment with the physician(s) listed elsewhere in this documentation. ° °You may take pain medication as needed but ONLY as prescribed.  Please also take your prescribed Flomax daily.  We also recommend that you take over-the-counter ibuprofen regularly according to label instructions over the next 5 days.  Take it with meals to minimize stomach discomfort. ° °Please see your doctor as soon as possible as stones may take 1-3 weeks to pass and you may require additional care or medications. ° °Do not drink alcohol, drive or participate in any other potentially dangerous activities while taking opiate pain medication as it may make you sleepy. Do not take this medication with any other sedating medications, either prescription or over-the-counter. If you were prescribed Percocet or Vicodin, do not take these with acetaminophen (Tylenol) as it is already contained within these medications. °  °Take Percocet as needed for severe pain.  This medication is an opiate (or narcotic) pain medication and can be habit forming.  Use it as little as possible to achieve adequate pain control.  Do not use or use it with extreme caution if you have a history of opiate abuse or dependence.  If you are on a pain contract with your primary care doctor or a pain specialist, be sure to let them know you were prescribed this medication today from the [facility].  This medication is intended for your use only - do not give any to anyone else and keep it in a secure place where nobody else, especially children, have access to it.  It will also cause or worsen constipation, so you may want to consider taking an over-the-counter stool softener while you are taking this medication. ° °Return to the  Emergency Department (ED) or call your doctor if you have any worsening pain, fever, painful urination, are unable to urinate, or develop other symptoms that concern you. ° °

## 2017-11-24 NOTE — ED Triage Notes (Signed)
Pt presents to ED with left lower abd pain and painful urination since around 0030. Pt states he feels pressure like he needs to urinate but "doesn't really have to go". Vomiting X3. Actively vomiting in triage.

## 2017-12-02 ENCOUNTER — Ambulatory Visit (INDEPENDENT_AMBULATORY_CARE_PROVIDER_SITE_OTHER): Payer: Managed Care, Other (non HMO) | Admitting: Family Medicine

## 2017-12-02 ENCOUNTER — Encounter: Payer: Self-pay | Admitting: Family Medicine

## 2017-12-02 VITALS — BP 118/80 | HR 80 | Ht 76.0 in | Wt 228.0 lb

## 2017-12-02 DIAGNOSIS — N200 Calculus of kidney: Secondary | ICD-10-CM

## 2017-12-02 DIAGNOSIS — I861 Scrotal varices: Secondary | ICD-10-CM | POA: Diagnosis not present

## 2017-12-02 DIAGNOSIS — N342 Other urethritis: Secondary | ICD-10-CM | POA: Diagnosis not present

## 2017-12-02 LAB — POCT URINALYSIS DIPSTICK

## 2017-12-02 MED ORDER — DOXYCYCLINE HYCLATE 100 MG PO TABS: 100.0000 mg | ORAL_TABLET | Freq: Two times a day (BID) | ORAL | 0 refills | Status: DC

## 2017-12-02 NOTE — Progress Notes (Signed)
Name: Juan Gregory   MRN: [id number]    DOB: 1980/05/16   Date:12/02/2017       Progress Note  Subjective  Chief Complaint  Chief Complaint  Patient presents with  . Follow-up    went to ER on Sept 10- didn't keep him- obstructing 37mm stone    Urinary Frequency   This is a new problem. The current episode started in the past 7 days. The problem occurs intermittently. The problem has been waxing and waning. The quality of the pain is described as burning. The pain is mild. There has been no fever. The fever has been present for less than 1 day. Associated symptoms include frequency. Pertinent negatives include no chills, discharge, flank pain, hematuria, hesitancy, nausea, sweats, urgency or vomiting. He has tried acetaminophen for the symptoms. The treatment provided moderate relief. His past medical history is significant for kidney stones. There is no history of catheterization, recurrent UTIs, a single kidney, urinary stasis or a urological procedure.    No problem-specific Assessment & Plan notes found for this encounter.   History reviewed. No pertinent past medical history.  Past Surgical History:  Procedure Laterality Date  . VARICOSE VEIN SURGERY Bilateral     History reviewed. No pertinent family history.  Social History   Socioeconomic History  . Marital status: Married    Spouse name: Not on file  . Number of children: Not on file  . Years of education: Not on file  . Highest education level: Not on file  Occupational History  . Not on file  Social Needs  . Financial resource strain: Not on file  . Food insecurity:    Worry: Not on file    Inability: Not on file  . Transportation needs:    Medical: Not on file    Non-medical: Not on file  Tobacco Use  . Smoking status: Never Smoker  . Smokeless tobacco: Current User    Types: Snuff  Substance and Sexual Activity  . Alcohol use: Yes    Alcohol/week: 0.0 standard drinks  . Drug use: No  . Sexual  activity: Not on file  Lifestyle  . Physical activity:    Days per week: Not on file    Minutes per session: Not on file  . Stress: Not on file  Relationships  . Social connections:    Talks on phone: Not on file    Gets together: Not on file    Attends religious service: Not on file    Active member of club or organization: Not on file    Attends meetings of clubs or organizations: Not on file    Relationship status: Not on file  . Intimate partner violence:    Fear of current or ex partner: Not on file    Emotionally abused: Not on file    Physically abused: Not on file    Forced sexual activity: Not on file  Other Topics Concern  . Not on file  Social History Narrative  . Not on file    No Known Allergies  Outpatient Medications Prior to Visit  Medication Sig Dispense Refill  . tamsulosin (FLOMAX) 0.4 MG CAPS capsule Take 1 tablet by mouth daily until you pass the kidney stone or no longer have symptoms 14 capsule 0  . oxyCODONE-acetaminophen (PERCOCET) 5-325 MG tablet Take 1-2 tablets by mouth every 6 (six) hours as needed for severe pain. (Patient not taking: Reported on 12/02/2017) 15 tablet 0  . docusate sodium (COLACE)  100 MG capsule Take 1 tablet once or twice daily as needed for constipation while taking narcotic pain medicine 30 capsule 0  . fexofenadine-pseudoephedrine (ALLEGRA-D 24) 180-240 MG 24 hr tablet Take 1 tablet by mouth daily. 30 tablet 11  . fluticasone (FLONASE) 50 MCG/ACT nasal spray Place 2 sprays into both nostrils daily. 16 g 11  . guaiFENesin-codeine (ROBITUSSIN AC) 100-10 MG/5ML syrup Take 5 mLs by mouth 3 (three) times daily as needed for cough. 150 mL 0  . ondansetron (ZOFRAN ODT) 4 MG disintegrating tablet Allow 1-2 tablets to dissolve in your mouth every 8 hours as needed for nausea/vomiting 30 tablet 0   No facility-administered medications prior to visit.     Review of Systems  Constitutional: Negative for chills, fever, malaise/fatigue and  weight loss.  HENT: Negative for ear discharge, ear pain and sore throat.   Eyes: Negative for blurred vision.  Respiratory: Negative for cough, sputum production, shortness of breath and wheezing.   Cardiovascular: Negative for chest pain, palpitations and leg swelling.  Gastrointestinal: Negative for abdominal pain, blood in stool, constipation, diarrhea, heartburn, melena, nausea and vomiting.  Genitourinary: Positive for frequency. Negative for dysuria, flank pain, hematuria, hesitancy and urgency.  Musculoskeletal: Negative for back pain, joint pain, myalgias and neck pain.  Skin: Negative for rash.  Neurological: Negative for dizziness, tingling, sensory change, focal weakness and headaches.  Endo/Heme/Allergies: Negative for environmental allergies and polydipsia. Does not bruise/bleed easily.  Psychiatric/Behavioral: Negative for depression and suicidal ideas. The patient is not nervous/anxious and does not have insomnia.      Objective  Vitals:   12/02/17 0802  BP: 118/80  Pulse: 80  Weight: 228 lb (103.4 kg)  Height: 6\' 4"  (1.93 m)    Physical Exam  Constitutional: He is oriented to person, place, and time.  HENT:  Head: Normocephalic.  Right Ear: External ear normal.  Left Ear: External ear normal.  Nose: Nose normal.  Mouth/Throat: Oropharynx is clear and moist.  Eyes: Pupils are equal, round, and reactive to light. Conjunctivae and EOM are normal. Right eye exhibits no discharge. Left eye exhibits no discharge. No scleral icterus.  Neck: Normal range of motion. Neck supple. No JVD present. No tracheal deviation present. No thyromegaly present.  Cardiovascular: Normal rate, regular rhythm, normal heart sounds and intact distal pulses. Exam reveals no gallop and no friction rub.  No murmur heard. Pulmonary/Chest: Breath sounds normal. No respiratory distress. He has no wheezes. He has no rales.  Abdominal: Soft. Bowel sounds are normal. He exhibits no mass. There is  no hepatosplenomegaly. There is no tenderness. There is no rebound, no guarding and no CVA tenderness.  Genitourinary: Penis normal. Right testis shows no mass, no swelling and no tenderness. Right testis is descended. Left testis shows no mass, no swelling and no tenderness. Left testis is descended.  Genitourinary Comments: Right varicocoel  Musculoskeletal: Normal range of motion. He exhibits no edema or tenderness.  Lymphadenopathy:    He has no cervical adenopathy.  Neurological: He is alert and oriented to person, place, and time. He has normal strength and normal reflexes. No cranial nerve deficit.  Skin: Skin is warm. No rash noted.  Nursing note and vitals reviewed.     Assessment & Plan  Problem List Items Addressed This Visit    None    Visit Diagnoses    Nephrolithiasis    -  Primary   Flank pain resolved next day and likely paased stone(no strain provided). May stop talmulosin.  Will recheck urine in 1 week after doxy.   Varicocele       Right scrotal area examined and varicocoel confirmed.   Urethritis       Patient took AZO/unable to read dipstick. will treat with doxycycline 100 bid for 7 days and recheck urine.   Relevant Medications   doxycycline (VIBRA-TABS) 100 MG tablet   Other Relevant Orders   POCT Urinalysis Dipstick (Completed)      Meds ordered this encounter  Medications  . doxycycline (VIBRA-TABS) 100 MG tablet    Sig: Take 1 tablet (100 mg total) by mouth 2 (two) times daily.    Dispense:  14 tablet    Refill:  0      Dr. Otilio Miu [facility]  12/02/17

## 2017-12-11 ENCOUNTER — Other Ambulatory Visit (INDEPENDENT_AMBULATORY_CARE_PROVIDER_SITE_OTHER): Payer: Managed Care, Other (non HMO)

## 2017-12-11 DIAGNOSIS — N342 Other urethritis: Secondary | ICD-10-CM | POA: Diagnosis not present

## 2017-12-11 LAB — POCT URINALYSIS DIPSTICK
BILIRUBIN UA: NEGATIVE
Blood, UA: NEGATIVE
GLUCOSE UA: NEGATIVE
Ketones, UA: NEGATIVE
LEUKOCYTES UA: NEGATIVE
Nitrite, UA: NEGATIVE
Protein, UA: NEGATIVE
Spec Grav, UA: 1.02 (ref 1.010–1.025)
Urobilinogen, UA: 0.2 E.U./dL
pH, UA: 7 (ref 5.0–8.0)

## 2017-12-14 ENCOUNTER — Encounter: Payer: Self-pay | Admitting: Family Medicine

## 2017-12-14 ENCOUNTER — Ambulatory Visit (INDEPENDENT_AMBULATORY_CARE_PROVIDER_SITE_OTHER): Payer: Managed Care, Other (non HMO) | Admitting: Family Medicine

## 2017-12-14 VITALS — BP 120/78 | HR 75 | Temp 98.0°F | Ht 76.0 in | Wt 228.0 lb

## 2017-12-14 DIAGNOSIS — J40 Bronchitis, not specified as acute or chronic: Secondary | ICD-10-CM | POA: Diagnosis not present

## 2017-12-14 DIAGNOSIS — J0100 Acute maxillary sinusitis, unspecified: Secondary | ICD-10-CM

## 2017-12-14 MED ORDER — AZITHROMYCIN 250 MG PO TABS: ORAL_TABLET | ORAL | 0 refills | Status: DC

## 2017-12-14 MED ORDER — GUAIFENESIN-CODEINE 100-10 MG/5ML PO SYRP: 5.0000 mL | ORAL_SOLUTION | Freq: Three times a day (TID) | ORAL | 0 refills | Status: DC | PRN

## 2017-12-14 NOTE — Progress Notes (Signed)
Date:  12/14/2017   Name:  Juan Gregory   DOB:  07/07/1980   MRN:  [id number]   Chief Complaint: Cough (Drainage into throat causing sore throat. Started Last Weds. ) Sinusitis  This is a new problem. The current episode started in the past 7 days (Wednesday). The problem has been gradually worsening since onset. There has been no fever. His pain is at a severity of 3/10. Associated symptoms include congestion, coughing, ear pain, a hoarse voice, shortness of breath, sinus pressure, sneezing and a sore throat. Pertinent negatives include no chills, diaphoresis, headaches, neck pain or swollen glands. (Prod clear cough/bilat ears congested/) Past treatments include acetaminophen and oral decongestants. The treatment provided mild relief.     Review of Systems  Constitutional: Negative for chills, diaphoresis and fever.  HENT: Positive for congestion, ear pain, hoarse voice, postnasal drip, rhinorrhea, sinus pressure, sinus pain, sneezing and sore throat. Negative for drooling, ear discharge and hearing loss.   Eyes: Negative for itching.  Respiratory: Positive for cough and shortness of breath. Negative for wheezing.   Cardiovascular: Negative for chest pain, palpitations and leg swelling.  Gastrointestinal: Negative for abdominal pain, blood in stool, constipation, diarrhea and nausea.  Endocrine: Negative for polydipsia.  Genitourinary: Negative for dysuria, frequency, hematuria and urgency.  Musculoskeletal: Negative for back pain, myalgias and neck pain.  Skin: Negative for rash.  Allergic/Immunologic: Negative for environmental allergies.  Neurological: Negative for dizziness and headaches.  Hematological: Does not bruise/bleed easily.  Psychiatric/Behavioral: Negative for suicidal ideas. The patient is not nervous/anxious.     There are no active problems to display for this patient.   No Known Allergies  Past Surgical History:  Procedure Laterality Date  . VARICOSE  VEIN SURGERY Bilateral     Social History   Tobacco Use  . Smoking status: Never Smoker  . Smokeless tobacco: Current User    Types: Snuff  Substance Use Topics  . Alcohol use: Yes    Alcohol/week: 0.0 standard drinks  . Drug use: No     Medication list has been reviewed and updated.  Current Meds  Medication Sig  . pseudoephedrine-acetaminophen (TYLENOL SINUS) 30-500 MG TABS tablet Take 1 tablet by mouth every 4 (four) hours as needed.    PHQ 2/9 Scores 12/02/2017  PHQ - 2 Score 0    Physical Exam  Constitutional: He is oriented to person, place, and time. He appears well-developed and well-nourished.  HENT:  Head: Normocephalic.  Right Ear: External ear normal.  Left Ear: Tympanic membrane, external ear and ear canal normal.  Nose: Nose normal. No mucosal edema. Right sinus exhibits no maxillary sinus tenderness and no frontal sinus tenderness. Left sinus exhibits no maxillary sinus tenderness and no frontal sinus tenderness.  Mouth/Throat: Posterior oropharyngeal erythema present. No oropharyngeal exudate or posterior oropharyngeal edema.  Cerumen impaction right  Eyes: Pupils are equal, round, and reactive to light. Conjunctivae and EOM are normal. Right eye exhibits no discharge. Left eye exhibits no discharge. No scleral icterus.  Neck: Normal range of motion. Neck supple. No JVD present. No tracheal deviation present. No thyromegaly present.  Cardiovascular: Normal rate, regular rhythm, normal heart sounds and intact distal pulses. Exam reveals no gallop and no friction rub.  No murmur heard. Pulmonary/Chest: Breath sounds normal. No respiratory distress. He has no wheezes. He has no rales.  Abdominal: Soft. Bowel sounds are normal. He exhibits no mass. There is no hepatosplenomegaly. There is no tenderness. There is no rebound, no  guarding and no CVA tenderness.  Musculoskeletal: Normal range of motion. He exhibits no edema or tenderness.  Lymphadenopathy:        Head (right side): No submandibular adenopathy present.       Head (left side): No submandibular adenopathy present.    He has no cervical adenopathy.       Right cervical: No superficial cervical adenopathy present.      Left cervical: No superficial cervical adenopathy present.  Neurological: He is alert and oriented to person, place, and time. He has normal strength and normal reflexes. No cranial nerve deficit.  Skin: Skin is warm. No rash noted.  Nursing note and vitals reviewed.   BP 120/78 (BP Location: Left Arm, Patient Position: Sitting, Cuff Size: Normal)   Pulse 75   Temp 98 F (36.7 C) (Oral)   Ht 6\' 4"  (1.93 m)   Wt 228 lb (103.4 kg)   SpO2 98%   BMI 27.75 kg/m   Assessment and Plan:  1. Acute non-recurrent maxillary sinusitis Acute Will initiate azithromycin 250 mg  - azithromycin (ZITHROMAX) 250 MG tablet; 2 today then 1 a day for 4 days  Dispense: 6 tablet; Refill: 0  2. Bronchitis Control sx with robitussin ac. - guaiFENesin-codeine (ROBITUSSIN AC) 100-10 MG/5ML syrup; Take 5 mLs by mouth 3 (three) times daily as needed for cough.  Dispense: 100 mL; Refill: 0   Dr. Otilio Miu [facility]  12/14/2017

## 2018-01-25 ENCOUNTER — Ambulatory Visit (INDEPENDENT_AMBULATORY_CARE_PROVIDER_SITE_OTHER): Payer: Managed Care, Other (non HMO) | Admitting: Family Medicine

## 2018-01-25 ENCOUNTER — Encounter: Payer: Self-pay | Admitting: Family Medicine

## 2018-01-25 VITALS — BP 120/80 | HR 80 | Ht 76.0 in | Wt 236.0 lb

## 2018-01-25 DIAGNOSIS — Z1211 Encounter for screening for malignant neoplasm of colon: Secondary | ICD-10-CM

## 2018-01-25 DIAGNOSIS — D225 Melanocytic nevi of trunk: Secondary | ICD-10-CM | POA: Diagnosis not present

## 2018-01-25 DIAGNOSIS — Z0000 Encounter for general adult medical examination without abnormal findings: Secondary | ICD-10-CM | POA: Diagnosis not present

## 2018-01-25 LAB — HEMOCCULT GUIAC POC 1CARD (OFFICE): FECAL OCCULT BLD: NEGATIVE

## 2018-01-25 NOTE — Progress Notes (Signed)
Date:  01/25/2018   Name:  Juan Gregory   DOB:  1981/01/22   MRN:  [id number]   Chief Complaint: Annual Exam (no concerns) Patient is a 37 year old male who presents for a comprehensive physical exam. The patient reports the following problems: none. Health maintenance has been reviewed refuses influenza.    Review of Systems  Constitutional: Negative for appetite change, chills, fatigue, fever and unexpected weight change.  HENT: Negative for drooling, ear discharge, ear pain, facial swelling, hearing loss, nosebleeds, sneezing, sore throat and trouble swallowing.   Eyes: Negative for photophobia, pain, discharge, redness, itching and visual disturbance.  Respiratory: Negative for cough, choking, chest tightness, shortness of breath and wheezing.   Cardiovascular: Negative for chest pain, palpitations and leg swelling.  Gastrointestinal: Negative for abdominal pain, blood in stool, constipation, diarrhea, nausea, rectal pain and vomiting.  Endocrine: Negative for cold intolerance, heat intolerance, polydipsia, polyphagia and polyuria.  Genitourinary: Negative for decreased urine volume, difficulty urinating, discharge, dysuria, flank pain, frequency, hematuria, penile pain, penile swelling, scrotal swelling, testicular pain and urgency.  Musculoskeletal: Negative for back pain, joint swelling, myalgias, neck pain and neck stiffness.  Skin: Negative for color change and rash.  Allergic/Immunologic: Negative for environmental allergies and immunocompromised state.  Neurological: Negative for dizziness, tremors, seizures, syncope, speech difficulty, weakness, light-headedness, numbness and headaches.  Hematological: Does not bruise/bleed easily.  Psychiatric/Behavioral: Negative for agitation, behavioral problems, confusion, dysphoric mood, hallucinations, self-injury and suicidal ideas. The patient is not nervous/anxious.     There are no active problems to display for this  patient.   No Known Allergies  Past Surgical History:  Procedure Laterality Date  . VARICOSE VEIN SURGERY Bilateral     Social History   Tobacco Use  . Smoking status: Never Smoker  . Smokeless tobacco: Current User    Types: Snuff  Substance Use Topics  . Alcohol use: Yes    Alcohol/week: 0.0 standard drinks  . Drug use: No     Medication list has been reviewed and updated.  No outpatient medications have been marked as taking for the 01/25/18 encounter (Office Visit) with Juline Patch, MD.    [facility] 2/9 Scores 01/25/2018 12/02/2017  PHQ - 2 Score 0 0  PHQ- 9 Score 1 -    Physical Exam  Constitutional: He is oriented to person, place, and time. Vital signs are normal. He appears well-developed and well-nourished.  HENT:  Head: Normocephalic.  Right Ear: Hearing, tympanic membrane, external ear and ear canal normal.  Left Ear: Hearing, tympanic membrane, external ear and ear canal normal.  Nose: Nose normal. No mucosal edema.  Mouth/Throat: Uvula is midline and oropharynx is clear and moist. No oropharyngeal exudate, posterior oropharyngeal edema, posterior oropharyngeal erythema or tonsillar abscesses.  Eyes: Pupils are equal, round, and reactive to light. Conjunctivae, EOM and lids are normal. Right eye exhibits no discharge. Left eye exhibits no discharge. No scleral icterus.  Fundoscopic exam:      The right eye shows no AV nicking.       The left eye shows no AV nicking.  Neck: Trachea normal and normal range of motion. Neck supple. Normal carotid pulses, no hepatojugular reflux and no JVD present. Carotid bruit is not present. No tracheal deviation present. No thyroid mass and no thyromegaly present.  Cardiovascular: Normal rate, regular rhythm, S1 normal, S2 normal, normal heart sounds and intact distal pulses. PMI is not displaced. Exam reveals no gallop, no S3, no S4 and  no friction rub.  No murmur heard.  No systolic murmur is present.  No diastolic murmur is  present. Pulses:      Carotid pulses are 2+ on the right side, and 2+ on the left side.      Radial pulses are 2+ on the right side, and 2+ on the left side.       Femoral pulses are 2+ on the right side, and 2+ on the left side.      Popliteal pulses are 2+ on the right side, and 2+ on the left side.       Dorsalis pedis pulses are 2+ on the right side, and 2+ on the left side.       Posterior tibial pulses are 2+ on the right side, and 2+ on the left side.  Pulmonary/Chest: Effort normal and breath sounds normal. No stridor. No respiratory distress. He has no decreased breath sounds. He has no wheezes. He has no rhonchi. He has no rales.  Abdominal: Soft. Normal appearance, normal aorta and bowel sounds are normal. He exhibits no mass. There is no hepatosplenomegaly. There is no tenderness. There is no rebound, no guarding and no CVA tenderness.  Genitourinary: Rectum normal, prostate normal, testes normal and penis normal. Rectal exam shows guaiac negative stool. Right testis shows no mass and no tenderness. Left testis shows no mass and no tenderness. Circumcised.  Genitourinary Comments: Right varicocoel  Musculoskeletal: Normal range of motion. He exhibits no edema or tenderness.  Feet:  Right Foot:  Skin Integrity: Negative for ulcer.  Left Foot:  Skin Integrity: Negative for ulcer.  Lymphadenopathy:       Head (right side): No submental and no submandibular adenopathy present.       Head (left side): No submental and no submandibular adenopathy present.    He has no cervical adenopathy.    He has no axillary adenopathy.  Neurological: He is alert and oriented to person, place, and time. He has normal strength and normal reflexes. No cranial nerve deficit or sensory deficit.  Reflex Scores:      Tricep reflexes are 2+ on the right side and 2+ on the left side.      Bicep reflexes are 2+ on the right side and 2+ on the left side.      Brachioradialis reflexes are 2+ on the right side  and 2+ on the left side.      Patellar reflexes are 2+ on the right side and 2+ on the left side.      Achilles reflexes are 2+ on the right side and 2+ on the left side. Skin: Skin is warm. Lesion noted. No rash noted.     Nursing note and vitals reviewed.   BP 120/80   Pulse 80   Ht 6\' 4"  (1.93 m)   Wt 236 lb (107 kg)   BMI 28.73 kg/m   Assessment and Plan:  1. Annual physical exam Juan Gregory is a 37 y.o. male who presents today for his Complete Annual Exam. He feels WELL. He He reports he is sleeping WELL/ draw renal, lipid/ guaiac negative - Renal Function Panel - Lipid panel - POCT occult blood stool  2. Melanocytic nevus of trunk Refer to derm for further eval - Ambulatory referral to Dermatology  3. Colon cancer screening Negative guaiac - POCT occult blood stool   Dr. Otilio Miu [facility]  01/25/2018

## 2018-11-15 ENCOUNTER — Ambulatory Visit (INDEPENDENT_AMBULATORY_CARE_PROVIDER_SITE_OTHER): Payer: Managed Care, Other (non HMO) | Admitting: Family Medicine

## 2018-11-15 ENCOUNTER — Other Ambulatory Visit: Payer: Self-pay

## 2018-11-15 ENCOUNTER — Encounter: Payer: Self-pay | Admitting: Family Medicine

## 2018-11-15 VITALS — BP 120/62 | HR 60 | Ht 76.0 in | Wt 225.0 lb

## 2018-11-15 DIAGNOSIS — I83813 Varicose veins of bilateral lower extremities with pain: Secondary | ICD-10-CM | POA: Diagnosis not present

## 2018-11-15 NOTE — Progress Notes (Signed)
Date:  11/15/2018   Name:  Juan Gregory   DOB:  Mar 09, 1981   MRN:  [id number]   Chief Complaint: Varicose Veins (L) leg is worse than R)- needs ref to V&V)  Patient is a 38 year old male who presents for a referral to vascular for varicose veins exam. The patient reports the following problems: varicosities. Health maintenance has been reviewed influenza in October. Leg Pain  The incident occurred more than 1 week ago. Injury mechanism: due to varicose veins. The pain is present in the left leg and right leg. Quality: tenderness. The pain has been constant since onset. Associated symptoms comments: Fatigue in legs. Treatments tried: vein surgery. The treatment provided moderate relief.    Review of Systems  Constitutional: Negative for chills and fever.  HENT: Negative for drooling, ear discharge, ear pain and sore throat.   Respiratory: Negative for cough, shortness of breath and wheezing.   Cardiovascular: Negative for chest pain, palpitations and leg swelling.  Gastrointestinal: Negative for abdominal pain, blood in stool, constipation, diarrhea and nausea.  Endocrine: Negative for polydipsia.  Genitourinary: Negative for dysuria, frequency, hematuria and urgency.  Musculoskeletal: Negative for back pain, myalgias and neck pain.  Skin: Negative for rash.  Allergic/Immunologic: Negative for environmental allergies.  Neurological: Negative for dizziness and headaches.  Hematological: Does not bruise/bleed easily.  Psychiatric/Behavioral: Negative for suicidal ideas. The patient is not nervous/anxious.     There are no active problems to display for this patient.   No Known Allergies  Past Surgical History:  Procedure Laterality Date  . VARICOSE VEIN SURGERY Bilateral     Social History   Tobacco Use  . Smoking status: Never Smoker  . Smokeless tobacco: Current User    Types: Snuff  Substance Use Topics  . Alcohol use: Yes    Alcohol/week: 0.0 standard drinks  .  Drug use: No     Medication list has been reviewed and updated.  No outpatient medications have been marked as taking for the 11/15/18 encounter (Office Visit) with Juline Patch, MD.    [facility] 2/9 Scores 11/15/2018 01/25/2018 12/02/2017  PHQ - 2 Score 0 0 0  PHQ- 9 Score 0 1 -    BP Readings from Last 3 Encounters:  11/15/18 120/62  01/25/18 120/80  12/14/17 120/78    Physical Exam Vitals signs and nursing note reviewed.  HENT:     Head: Normocephalic.     Right Ear: Tympanic membrane, ear canal and external ear normal.     Left Ear: Tympanic membrane, ear canal and external ear normal.     Nose: Nose normal.  Eyes:     General: No scleral icterus.       Right eye: No discharge.        Left eye: No discharge.     Conjunctiva/sclera: Conjunctivae normal.     Pupils: Pupils are equal, round, and reactive to light.  Neck:     Musculoskeletal: Normal range of motion and neck supple.     Thyroid: No thyromegaly.     Vascular: No JVD.     Trachea: No tracheal deviation.  Cardiovascular:     Rate and Rhythm: Normal rate and regular rhythm.     Pulses: Normal pulses.     Heart sounds: Normal heart sounds. No murmur. No systolic murmur. No diastolic murmur. No friction rub. No gallop. No S3 or S4 sounds.      Comments: Varicose veins bilateral Pulmonary:  Effort: No respiratory distress.     Breath sounds: Normal breath sounds. No wheezing, rhonchi or rales.  Abdominal:     General: Bowel sounds are normal.     Palpations: Abdomen is soft. There is no mass.     Tenderness: There is no abdominal tenderness. There is no guarding or rebound.  Musculoskeletal: Normal range of motion.        General: No tenderness.  Lymphadenopathy:     Cervical: No cervical adenopathy.  Skin:    General: Skin is warm.     Capillary Refill: Capillary refill takes less than 2 seconds.     Findings: No rash.  Neurological:     Mental Status: He is alert and oriented to person, place, and  time.     Cranial Nerves: No cranial nerve deficit.     Deep Tendon Reflexes: Reflexes are normal and symmetric.     Wt Readings from Last 3 Encounters:  11/15/18 225 lb (102.1 kg)  01/25/18 236 lb (107 kg)  12/14/17 228 lb (103.4 kg)    BP 120/62   Pulse 60   Ht 6\' 4"  (1.93 m)   Wt 225 lb (102.1 kg)   BMI 27.39 kg/m   Assessment and Plan: 1. Varicose veins of both lower extremities with pain Patient with history of varicose veins of both legs and is currently having an exacerbation with discomfort and fatigue in the legs.  Patient has had prior procedures done at vascular including injections for sclerosing as well as embolization.  We will refer to vascular surgery for evaluation and treatment. - Ambulatory referral to Vascular Surgery

## 2018-12-02 ENCOUNTER — Other Ambulatory Visit (INDEPENDENT_AMBULATORY_CARE_PROVIDER_SITE_OTHER): Payer: Self-pay | Admitting: Vascular Surgery

## 2018-12-02 DIAGNOSIS — I83813 Varicose veins of bilateral lower extremities with pain: Secondary | ICD-10-CM

## 2018-12-03 ENCOUNTER — Encounter (INDEPENDENT_AMBULATORY_CARE_PROVIDER_SITE_OTHER): Payer: Self-pay | Admitting: Nurse Practitioner

## 2018-12-03 ENCOUNTER — Other Ambulatory Visit: Payer: Self-pay

## 2018-12-03 ENCOUNTER — Ambulatory Visit (INDEPENDENT_AMBULATORY_CARE_PROVIDER_SITE_OTHER): Payer: Managed Care, Other (non HMO)

## 2018-12-03 ENCOUNTER — Ambulatory Visit (INDEPENDENT_AMBULATORY_CARE_PROVIDER_SITE_OTHER): Payer: Managed Care, Other (non HMO) | Admitting: Nurse Practitioner

## 2018-12-03 VITALS — BP 116/76 | HR 60 | Resp 16 | Ht 76.0 in | Wt 219.6 lb

## 2018-12-03 DIAGNOSIS — I83813 Varicose veins of bilateral lower extremities with pain: Secondary | ICD-10-CM

## 2018-12-03 DIAGNOSIS — F17200 Nicotine dependence, unspecified, uncomplicated: Secondary | ICD-10-CM

## 2018-12-06 ENCOUNTER — Encounter (INDEPENDENT_AMBULATORY_CARE_PROVIDER_SITE_OTHER): Payer: Self-pay | Admitting: Nurse Practitioner

## 2018-12-06 DIAGNOSIS — I83813 Varicose veins of bilateral lower extremities with pain: Secondary | ICD-10-CM | POA: Insufficient documentation

## 2018-12-06 NOTE — Progress Notes (Addendum)
SUBJECTIVE:  Patient ID: Juan Gregory, male    DOB: [birth year], 38 y.o.   MRN: [id number] Chief Complaint  Patient presents with  . New Patient (Initial Visit)    ref Ronnald Ramp for varicose veins    HPI  Juan Gregory is a 38 y.o. male that presents today for evaluation of his varicose veins.  The patient has had previous bilateral great saphenous vein ablations which he estimates to be about 10 years ago.  Despite these ablations the patient continues to have a very large varicosities bilaterally.  He states that these varicosities get steadily worse throughout the day and by the end of the day they are tired, achy, throbbing, itchy, and painful.  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  The pain in his bilateral lower extremities makes it difficult to perform his activities of daily living as well as to perform his work duties.  The patient is also required to wear steel toe boots which makes this pain even worse.  The patient wears medical grade 1 compression stockings, which she is worn ever since his ablation 10 years ago.  The patient underwent sclerotherapy approximately 3 to 4 years ago however these were more so helpful for spider veins however not the very large varicosities.  The patient also elevates his lower extremities as soon as he gets home and he takes NSAIDs for pain relief.  Today noninvasive study shows that the great saphenous vein continues to be ablated.  However there is reflux within the common femoral vein, femoral vein, saphenofemoral junction, and popliteal vein bilaterally.  The right small saphenous vein has reflux.  It is shown that there is a large varicosity in the area of the great saphenous vein, likely an anterior accessory vein.  Measurements of the varicosities in the bilateral thigh and knee area include a maximum diameter of 5.7 mm on the right and 6.6 mm on the left.  History reviewed. No  pertinent past medical history.  Past Surgical History:  Procedure Laterality Date  . VARICOSE VEIN SURGERY Bilateral     Social History   Socioeconomic History  . Marital status: Married    Spouse name: Not on file  . Number of children: Not on file  . Years of education: Not on file  . Highest education level: Not on file  Occupational History  . Not on file  Social Needs  . Financial resource strain: Not on file  . Food insecurity    Worry: Not on file    Inability: Not on file  . Transportation needs    Medical: Not on file    Non-medical: Not on file  Tobacco Use  . Smoking status: Never Smoker  . Smokeless tobacco: Current User    Types: Snuff  Substance and Sexual Activity  . Alcohol use: Yes    Alcohol/week: 0.0 standard drinks  . Drug use: No  . Sexual activity: Not on file  Lifestyle  . Physical activity    Days per week: Not on file    Minutes per session: Not on file  . Stress: Not on file  Relationships  . Social Herbalist on phone: Not on file    Gets together: Not on file    Attends religious service: Not on file    Active member of club or organization: Not on file    Attends meetings of clubs  or organizations: Not on file    Relationship status: Not on file  . Intimate partner violence    Fear of current or ex partner: Not on file    Emotionally abused: Not on file    Physically abused: Not on file    Forced sexual activity: Not on file  Other Topics Concern  . Not on file  Social History Narrative  . Not on file    Family History  Problem Relation Age of Onset  . Heart attack Father     No Known Allergies   Review of Systems   Review of Systems: Negative Unless Checked Constitutional: [] Weight loss  [] Fever  [] Chills Cardiac: [] Chest pain   []  Atrial Fibrillation  [] Palpitations   [] Shortness of breath when laying flat   [] Shortness of breath with exertion. [] Shortness of breath at rest Vascular:  [] Pain in legs with  walking   [] Pain in legs with standing [] Pain in legs when laying flat   [] Claudication    [] Pain in feet when laying flat    [] History of DVT   [] Phlebitis   [x] Swelling in legs   [x] Varicose veins   [] Non-healing ulcers Pulmonary:   [] Uses home oxygen   [] Productive cough   [] Hemoptysis   [] Wheeze  [] COPD   [] Asthma Neurologic:  [] Dizziness   [] Seizures  [] Blackouts [] History of stroke   [] History of TIA  [] Aphasia   [] Temporary Blindness   [] Weakness or numbness in arm   [] Weakness or numbness in leg Musculoskeletal:   [] Joint swelling   [] Joint pain   [] Low back pain  []  History of Knee Replacement [] Arthritis [] back Surgeries  []  Spinal Stenosis    Hematologic:  [] Easy bruising  [] Easy bleeding   [] Hypercoagulable state   [] Anemic Gastrointestinal:  [] Diarrhea   [] Vomiting  [] Gastroesophageal reflux/heartburn   [] Difficulty swallowing. [] Abdominal pain Genitourinary:  [] Chronic kidney disease   [] Difficult urination  [] Anuric   [] Blood in urine [] Frequent urination  [] Burning with urination   [] Hematuria Skin:  [] Rashes   [] Ulcers [] Wounds Psychological:  [] History of anxiety   []  History of major depression  []  Memory Difficulties      OBJECTIVE:   Physical Exam  BP 116/76 (BP Location: Right Arm)   Pulse 60   Resp 16   Ht 6\' 4"  (1.93 m)   Wt 219 lb 9.6 oz (99.6 kg)   BMI 26.73 kg/m   Gen: WD/WN, NAD Head: Branch/AT, No temporalis wasting.  Ear/Nose/Throat: Hearing grossly intact, nares w/o erythema or drainage Eyes: PER, EOMI, sclera nonicteric.  Neck: Supple, no masses.  No JVD.  Pulmonary:  Good air movement, no use of accessory muscles.  Cardiac: RRR Vascular: scattered varicosities present bilaterally.  Mild venous stasis changes to the legs bilaterally.  2+ soft pitting edema.  Large tributaries of the great saphenous vein very prominent bilaterally.  5.7 mm on the right and 6.6 mm on the left.  Vessel Right Left  Radial Palpable Palpable  Dorsalis Pedis Palpable Palpable   Posterior Tibial Palpable Palpable   Gastrointestinal: soft, non-distended. No guarding/no peritoneal signs.  Musculoskeletal: M/S 5/5 throughout.  No deformity or atrophy.  Neurologic: Pain and light touch intact in extremities.  Symmetrical.  Speech is fluent. Motor exam as listed above. Psychiatric: Judgment intact, Mood & affect appropriate for pt's clinical situation. Dermatologic: No Venous rashes. No Ulcers Noted.  No changes consistent with cellulitis. Lymph : No Cervical lymphadenopathy, no lichenification or skin changes of chronic lymphedema.  ASSESSMENT AND PLAN:  1. Varicose veins of bilateral lower extremities with pain Today noninvasive study shows that the great saphenous vein continues to be ablated.  However there is reflux within the common femoral vein, femoral vein, saphenofemoral junction, and popliteal vein bilaterally.  The right small saphenous vein has reflux.  It is shown that there is a large varicosity in the area of the great saphenous vein, likely an anterior accessory vein.   Recommend:  The patient has had successful ablation of the previously incompetent saphenous venous system but still has persistent symptoms of pain and swelling that are having a negative impact on daily life and daily activities.  Patient should undergo foam sclerotherapy to treat the residual varicosities as saline sclerotherapy would not be very effective.  The risks, benefits and alternative therapies were reviewed in detail with the patient.  All questions were answered.  The patient agrees to proceed with sclerotherapy at their convenience.  The patient will continue wearing the graduated compression stockings and using the over-the-counter pain medications to treat her symptoms.       2. Tobacco use disorder Tobacco (snuff) cessation was discussed, 3-10 minutes spent on this topic specifically    No current outpatient medications on file prior to visit.   No  current facility-administered medications on file prior to visit.     There are no Patient Instructions on file for this visit. No follow-ups on file.   Kris Hartmann, NP  This note was completed with Sales executive.  Any errors are purely unintentional.

## 2018-12-15 ENCOUNTER — Other Ambulatory Visit (INDEPENDENT_AMBULATORY_CARE_PROVIDER_SITE_OTHER): Payer: Self-pay | Admitting: Nurse Practitioner

## 2019-03-01 ENCOUNTER — Other Ambulatory Visit: Payer: Self-pay

## 2019-03-01 ENCOUNTER — Encounter (INDEPENDENT_AMBULATORY_CARE_PROVIDER_SITE_OTHER): Payer: Self-pay | Admitting: Vascular Surgery

## 2019-03-01 ENCOUNTER — Ambulatory Visit (INDEPENDENT_AMBULATORY_CARE_PROVIDER_SITE_OTHER): Payer: Managed Care, Other (non HMO) | Admitting: Vascular Surgery

## 2019-03-01 VITALS — BP 146/83 | HR 83 | Resp 16 | Ht 76.0 in | Wt 231.0 lb

## 2019-03-01 DIAGNOSIS — I83812 Varicose veins of left lower extremities with pain: Secondary | ICD-10-CM | POA: Diagnosis not present

## 2019-03-01 DIAGNOSIS — I83813 Varicose veins of bilateral lower extremities with pain: Secondary | ICD-10-CM

## 2019-03-01 NOTE — Progress Notes (Signed)
  Juan Gregory is a 38 y.o.male who presents with painful varicose veins of the left leg  No past medical history on file.  Past Surgical History:  Procedure Laterality Date  . VARICOSE VEIN SURGERY Bilateral     No current outpatient medications on file.   No current facility-administered medications for this visit.    No Known Allergies  Indication: Patient presents with symptomatic varicose veins of the left lower extremity.  Procedure: Foam sclerotherapy was performed on the left lower extremity. Using ultrasound guidance, 5 mL of foam Sotradecol was used to inject the varicosities of the left lower extremity. Compression wraps were placed. The patient tolerated the procedure well.

## 2019-04-01 ENCOUNTER — Ambulatory Visit (INDEPENDENT_AMBULATORY_CARE_PROVIDER_SITE_OTHER): Payer: Managed Care, Other (non HMO) | Admitting: Vascular Surgery

## 2019-04-14 ENCOUNTER — Telehealth (INDEPENDENT_AMBULATORY_CARE_PROVIDER_SITE_OTHER): Payer: Self-pay | Admitting: Vascular Surgery

## 2019-04-14 NOTE — Telephone Encounter (Signed)
After last sclero a hard spot has come up but is going away but really slowly and would like to know if that is normal please call and advise.

## 2019-04-14 NOTE — Telephone Encounter (Signed)
Contacted patient spouse to let her know it is normal to have a hard spot in the area after foam sclerotherapy. She was instructed that it could take several weeks but it should gradually get smaller.

## 2019-04-29 ENCOUNTER — Ambulatory Visit (INDEPENDENT_AMBULATORY_CARE_PROVIDER_SITE_OTHER): Payer: Managed Care, Other (non HMO) | Admitting: Vascular Surgery

## 2019-04-29 ENCOUNTER — Encounter (INDEPENDENT_AMBULATORY_CARE_PROVIDER_SITE_OTHER): Payer: Self-pay

## 2019-05-11 ENCOUNTER — Other Ambulatory Visit: Payer: Self-pay

## 2019-05-11 ENCOUNTER — Encounter: Payer: Self-pay | Admitting: Family Medicine

## 2019-05-11 ENCOUNTER — Ambulatory Visit (INDEPENDENT_AMBULATORY_CARE_PROVIDER_SITE_OTHER): Payer: 59 | Admitting: Family Medicine

## 2019-05-11 VITALS — BP 130/78 | HR 76 | Temp 98.1°F | Ht 76.0 in | Wt 224.0 lb

## 2019-05-11 DIAGNOSIS — Z0000 Encounter for general adult medical examination without abnormal findings: Secondary | ICD-10-CM

## 2019-05-11 DIAGNOSIS — Z6825 Body mass index (BMI) 25.0-25.9, adult: Secondary | ICD-10-CM | POA: Diagnosis not present

## 2019-05-11 DIAGNOSIS — J0100 Acute maxillary sinusitis, unspecified: Secondary | ICD-10-CM

## 2019-05-11 DIAGNOSIS — Z1211 Encounter for screening for malignant neoplasm of colon: Secondary | ICD-10-CM

## 2019-05-11 LAB — HEMOCCULT GUIAC POC 1CARD (OFFICE): Fecal Occult Blood, POC: NEGATIVE

## 2019-05-11 MED ORDER — DOXYCYCLINE HYCLATE 100 MG PO TABS: 100.0000 mg | ORAL_TABLET | Freq: Two times a day (BID) | ORAL | 0 refills | Status: DC

## 2019-05-11 NOTE — Patient Instructions (Signed)

## 2019-05-11 NOTE — Progress Notes (Signed)
Date:  05/11/2019   Name:  Juan Gregory   DOB:  1981/03/07   MRN:  [id number]   Chief Complaint: Annual Exam and Sinusitis (sinus pressure, nasal drainage- mostly clear)  Patient is a 39 year old male who presents for a comprehensive physical exam. The patient reports the following problems:sinus infection. Health maintenance has been reviewed up to date.  Sinusitis This is a new problem. The current episode started in the past 7 days (Monday). There has been no fever. He is experiencing no pain. Pertinent negatives include no chills, coughing, ear pain, headaches, neck pain, shortness of breath or sore throat.    Lab Results  Component Value Date   CREATININE 1.05 11/24/2017   BUN 14 11/24/2017   NA 139 11/24/2017   K 3.7 11/24/2017   CL 106 11/24/2017   CO2 25 11/24/2017   No results found for: CHOL, HDL, LDLCALC, LDLDIRECT, TRIG, CHOLHDL No results found for: TSH No results found for: HGBA1C   Review of Systems  Constitutional: Negative for chills and fever.  HENT: Negative for drooling, ear discharge, ear pain and sore throat.   Respiratory: Negative for cough, shortness of breath and wheezing.   Cardiovascular: Negative for chest pain, palpitations and leg swelling.  Gastrointestinal: Negative for abdominal pain, blood in stool, constipation, diarrhea and nausea.  Endocrine: Negative for polydipsia.  Genitourinary: Negative for dysuria, frequency, hematuria and urgency.  Musculoskeletal: Negative for back pain, myalgias and neck pain.  Skin: Negative for rash.  Allergic/Immunologic: Negative for environmental allergies.  Neurological: Negative for dizziness and headaches.  Hematological: Does not bruise/bleed easily.  Psychiatric/Behavioral: Negative for suicidal ideas. The patient is not nervous/anxious.     Patient Active Problem List   Diagnosis Date Noted  . Varicose veins of bilateral lower extremities with pain 12/06/2018  . Infertility male 05/30/2013     No Known Allergies  Past Surgical History:  Procedure Laterality Date  . VARICOSE VEIN SURGERY Bilateral     Social History   Tobacco Use  . Smoking status: Never Smoker  . Smokeless tobacco: Current User    Types: Snuff  Substance Use Topics  . Alcohol use: Yes    Alcohol/week: 0.0 standard drinks  . Drug use: No     Medication list has been reviewed and updated.  No outpatient medications have been marked as taking for the 05/11/19 encounter (Office Visit) with Juline Patch, MD.    [facility] 2/9 Scores 05/11/2019 11/15/2018 01/25/2018 12/02/2017  PHQ - 2 Score 0 0 0 0  PHQ- 9 Score 0 0 1 -    BP Readings from Last 3 Encounters:  05/11/19 130/78  03/01/19 (!) 146/83  12/03/18 116/76    Physical Exam Vitals and nursing note reviewed.  Constitutional:      General: He is not in acute distress.    Appearance: Normal appearance. He is well-developed, well-groomed and normal weight.  HENT:     Head: Normocephalic.     Jaw: There is normal jaw occlusion.     Right Ear: Hearing, tympanic membrane, ear canal and external ear normal.     Left Ear: Hearing, tympanic membrane, ear canal and external ear normal.     Nose:     Right Sinus: Maxillary sinus tenderness present.     Left Sinus: Maxillary sinus tenderness present.     Mouth/Throat:     Lips: Pink.     Mouth: Mucous membranes are moist.     Dentition: Normal  dentition.     Tongue: No lesions.     Palate: No mass.     Pharynx: Oropharynx is clear. Uvula midline.  Eyes:     General: Lids are normal. Vision grossly intact. Gaze aligned appropriately. No scleral icterus.       Right eye: No discharge.        Left eye: No discharge.     Extraocular Movements: Extraocular movements intact.     Conjunctiva/sclera: Conjunctivae normal.     Pupils: Pupils are equal, round, and reactive to light.  Neck:     Thyroid: No thyroid mass, thyromegaly or thyroid tenderness.     Vascular: No JVD.     Trachea: No tracheal  deviation.  Cardiovascular:     Rate and Rhythm: Normal rate and regular rhythm.     Chest Wall: PMI is not displaced.     Pulses: Normal pulses.          Carotid pulses are 2+ on the right side and 2+ on the left side.      Radial pulses are 2+ on the right side and 2+ on the left side.       Femoral pulses are 2+ on the right side and 2+ on the left side.      Popliteal pulses are 2+ on the right side and 2+ on the left side.       Dorsalis pedis pulses are 2+ on the right side and 2+ on the left side.       Posterior tibial pulses are 2+ on the right side and 2+ on the left side.     Heart sounds: Normal heart sounds, S1 normal and S2 normal. No murmur. No systolic murmur. No diastolic murmur. No friction rub. No gallop. No S3 or S4 sounds.   Pulmonary:     Effort: No respiratory distress.     Breath sounds: Normal breath sounds. No decreased breath sounds, wheezing, rhonchi or rales.  Chest:     Chest wall: No mass.     Breasts:        Right: Normal.        Left: Normal.  Abdominal:     General: Bowel sounds are normal.     Palpations: Abdomen is soft. There is no hepatomegaly, splenomegaly or mass.     Tenderness: There is no abdominal tenderness. There is no guarding or rebound.  Genitourinary:    Pubic Area: No rash.      Penis: Normal.      Testes: Normal.        Right: Mass or tenderness not present.        Left: Mass or tenderness not present.     Prostate: Normal. Not enlarged, not tender and no nodules present.     Rectum: Normal. Guaiac result negative.  Musculoskeletal:        General: No tenderness. Normal range of motion.     Cervical back: Normal, full passive range of motion without pain, normal range of motion and neck supple.     Thoracic back: Normal.     Lumbar back: Normal.     Right lower leg: No edema.     Left lower leg: No edema.  Feet:     Right foot:     Skin integrity: Skin integrity normal.     Left foot:     Skin integrity: Skin integrity  normal.  Lymphadenopathy:     Head:     Right side  of head: No submandibular or tonsillar adenopathy.     Left side of head: No submandibular or tonsillar adenopathy.     Cervical: No cervical adenopathy.     Right cervical: No superficial, deep or posterior cervical adenopathy.    Left cervical: No superficial, deep or posterior cervical adenopathy.     Upper Body:     Right upper body: No supraclavicular or axillary adenopathy.     Left upper body: No supraclavicular or axillary adenopathy.     Lower Body: No right inguinal adenopathy. No left inguinal adenopathy.  Skin:    General: Skin is warm.     Capillary Refill: Capillary refill takes less than 2 seconds.     Findings: No rash.     Comments: subungal hematoma  Neurological:     Mental Status: He is alert and oriented to person, place, and time.     Cranial Nerves: Cranial nerves are intact. No cranial nerve deficit.     Sensory: Sensation is intact.     Motor: Motor function is intact.     Deep Tendon Reflexes: Reflexes are normal and symmetric.  Psychiatric:        Behavior: Behavior is cooperative.     Wt Readings from Last 3 Encounters:  05/11/19 224 lb (101.6 kg)  03/01/19 231 lb (104.8 kg)  12/03/18 219 lb 9.6 oz (99.6 kg)    BP 130/78   Pulse 76   Temp 98.1 F (36.7 C) (Oral)   Ht 6\' 4"  (1.93 m)   Wt 224 lb (101.6 kg)   BMI 27.27 kg/m   Assessment and Plan:  1. Annual physical exam No subjective/objective concerns noted during the history and physical exam.  Patient's previous encounters, most recent labs, and most recent imaging care elsewhere evaluations reviewed.Juan Gregory is a 39 y.o. male who presents today for his Complete Annual Exam. He feels well. He reports exercising . He reports he is sleeping well. Immunizations are reviewed and recommendations provided.   Age appropriate screening tests are discussed. Counseling given for risk factor reduction interventions. - Lipid Panel With LDL/HDL  Ratio - Renal Function Panel - POCT Occult Blood Stool  2. Acute non-recurrent maxillary sinusitis Acute.  Recurrent.  Persistent.  History and exam is consistent with bilateral maxillary sinusitis.  Will initiate doxycycline 100 mg twice a day for 10 days. - doxycycline (VIBRA-TABS) 100 MG tablet; Take 1 tablet (100 mg total) by mouth 2 (two) times daily.  Dispense: 20 tablet; Refill: 0  3. Body mass index (BMI) 25.0-29.9, adult Mild elevation of BMI to 27.27.Health risks of being over weight were discussed and patient was counseled on weight loss options and exercise.  Patient was given Mediterranean diet for guidelines.  4. Colon cancer screening This was discussed with patient and patient agrees to pursue with colon cancer screening with colonoscopy.  We will submit referral via portal to  GI. - POCT Occult Blood Stool  Patient was noted to to fingers with subungual hematomas of recent onset due to trauma.  This was released with cautery.  Patient had immediate relief.  Dressing was applied

## 2019-05-12 LAB — RENAL FUNCTION PANEL
Albumin: 5.2 g/dL — ABNORMAL HIGH (ref 4.0–5.0)
BUN/Creatinine Ratio: 13 (ref 9–20)
BUN: 13 mg/dL (ref 6–24)
CO2: 22 mmol/L (ref 20–29)
Calcium: 9.8 mg/dL (ref 8.7–10.2)
Chloride: 102 mmol/L (ref 96–106)
Creatinine, Ser: 1.03 mg/dL (ref 0.76–1.27)
GFR calc Af Amer: 105 mL/min/{1.73_m2} (ref >59)
GFR calc non Af Amer: 90 mL/min/{1.73_m2} (ref >59)
Glucose: 78 mg/dL (ref 65–99)
Phosphorus: 3.1 mg/dL (ref 2.8–4.1)
Potassium: 4 mmol/L (ref 3.5–5.2)
Sodium: 142 mmol/L (ref 134–144)

## 2019-05-12 LAB — LIPID PANEL WITH LDL/HDL RATIO
Cholesterol, Total: 191 mg/dL (ref 100–199)
HDL: 53 mg/dL (ref >39)
LDL Chol Calc (NIH): 126 mg/dL — ABNORMAL HIGH (ref 0–99)
LDL/HDL Ratio: 2.4 ratio (ref 0.0–3.6)
Triglycerides: 63 mg/dL (ref 0–149)
VLDL Cholesterol Cal: 12 mg/dL (ref 5–40)

## 2019-05-20 ENCOUNTER — Other Ambulatory Visit: Payer: Self-pay

## 2019-05-20 ENCOUNTER — Ambulatory Visit (INDEPENDENT_AMBULATORY_CARE_PROVIDER_SITE_OTHER): Payer: 59 | Admitting: Vascular Surgery

## 2019-05-20 ENCOUNTER — Encounter (INDEPENDENT_AMBULATORY_CARE_PROVIDER_SITE_OTHER): Payer: Self-pay | Admitting: Vascular Surgery

## 2019-05-20 VITALS — BP 127/78 | HR 75 | Resp 16 | Wt 232.8 lb

## 2019-05-20 DIAGNOSIS — I83811 Varicose veins of right lower extremities with pain: Secondary | ICD-10-CM

## 2019-05-20 DIAGNOSIS — I83813 Varicose veins of bilateral lower extremities with pain: Secondary | ICD-10-CM

## 2019-05-20 NOTE — Progress Notes (Signed)
Juan Gregory is a 39 y.o.male who presents with painful varicose veins of the right leg  No past medical history on file.  Past Surgical History:  Procedure Laterality Date  . VARICOSE VEIN SURGERY Bilateral     Current Outpatient Medications  Medication Sig Dispense Refill  . doxycycline (VIBRA-TABS) 100 MG tablet Take 1 tablet (100 mg total) by mouth 2 (two) times daily. 20 tablet 0   No current facility-administered medications for this visit.    No Known Allergies  Indication: Patient presents with symptomatic varicose veins of the right lower extremity.  Procedure: Foam sclerotherapy was performed on the right lower extremity. Using ultrasound guidance, 5 mL of foam Sotradecol was used to inject the varicosities of the right lower extremity. Compression wraps were placed. The patient tolerated the procedure well.

## 2019-06-10 ENCOUNTER — Other Ambulatory Visit: Payer: Self-pay

## 2019-06-10 ENCOUNTER — Encounter (INDEPENDENT_AMBULATORY_CARE_PROVIDER_SITE_OTHER): Payer: Self-pay | Admitting: Vascular Surgery

## 2019-06-10 ENCOUNTER — Ambulatory Visit (INDEPENDENT_AMBULATORY_CARE_PROVIDER_SITE_OTHER): Payer: 59 | Admitting: Vascular Surgery

## 2019-06-10 VITALS — BP 120/71 | HR 90 | Resp 16 | Wt 231.0 lb

## 2019-06-10 DIAGNOSIS — I83812 Varicose veins of left lower extremities with pain: Secondary | ICD-10-CM

## 2019-06-10 DIAGNOSIS — I83813 Varicose veins of bilateral lower extremities with pain: Secondary | ICD-10-CM

## 2019-06-10 NOTE — Progress Notes (Signed)
  Juan Gregory is a 39 y.o.male who presents with painful varicose veins of the left leg  No past medical history on file.  Past Surgical History:  Procedure Laterality Date  . VARICOSE VEIN SURGERY Bilateral     Current Outpatient Medications  Medication Sig Dispense Refill  . doxycycline (VIBRA-TABS) 100 MG tablet Take 1 tablet (100 mg total) by mouth 2 (two) times daily. (Patient not taking: Reported on 06/10/2019) 20 tablet 0   No current facility-administered medications for this visit.    No Known Allergies  Indication: Patient presents with symptomatic varicose veins of the left lower extremity.  Procedure: Foam sclerotherapy was performed on the left lower extremity. Using ultrasound guidance, 5 mL of foam Sotradecol was used to inject the varicosities of the left lower extremity. Compression wraps were placed. The patient tolerated the procedure well.

## 2019-07-01 ENCOUNTER — Other Ambulatory Visit: Payer: Self-pay

## 2019-07-01 ENCOUNTER — Ambulatory Visit (INDEPENDENT_AMBULATORY_CARE_PROVIDER_SITE_OTHER): Payer: 59 | Admitting: Vascular Surgery

## 2019-07-01 ENCOUNTER — Encounter (INDEPENDENT_AMBULATORY_CARE_PROVIDER_SITE_OTHER): Payer: Self-pay | Admitting: Vascular Surgery

## 2019-07-01 VITALS — BP 120/81 | HR 79 | Resp 16 | Wt 226.0 lb

## 2019-07-01 DIAGNOSIS — I83812 Varicose veins of left lower extremities with pain: Secondary | ICD-10-CM

## 2019-07-01 DIAGNOSIS — I83813 Varicose veins of bilateral lower extremities with pain: Secondary | ICD-10-CM

## 2019-07-01 NOTE — Progress Notes (Signed)
  Juan Gregory is a 39 y.o.male who presents with painful varicose veins of the left leg  No past medical history on file.  Past Surgical History:  Procedure Laterality Date  . VARICOSE VEIN SURGERY Bilateral     Current Outpatient Medications  Medication Sig Dispense Refill  . doxycycline (VIBRA-TABS) 100 MG tablet Take 1 tablet (100 mg total) by mouth 2 (two) times daily. (Patient not taking: Reported on 06/10/2019) 20 tablet 0   No current facility-administered medications for this visit.    No Known Allergies  Indication: Patient presents with symptomatic varicose veins of the left lower extremity.  Procedure: Foam sclerotherapy was performed on the left lower extremity. Using ultrasound guidance, 5 mL of foam Sotradecol was used to inject the varicosities of the left lower extremity. Compression wraps were placed. The patient tolerated the procedure well.

## 2019-10-28 IMAGING — CT CT RENAL STONE PROTOCOL
2 of 4 series · 15 of 46 positions shown, 17 images · non-contrast
Comparison: None.

CLINICAL DATA: Left flank pain and painful urination.  Vomiting.

EXAM:
CT ABDOMEN AND PELVIS WITHOUT CONTRAST
TECHNIQUE: Multidetector CT imaging of the abdomen and pelvis was performed
following the standard protocol without IV contrast.

[Series 2: stone full standard · axial · 0.78mm/px · z∈[-1125,-645]mm · 12 of 110 slices shown, 14 images]
[im 9/110  soft-tissue]
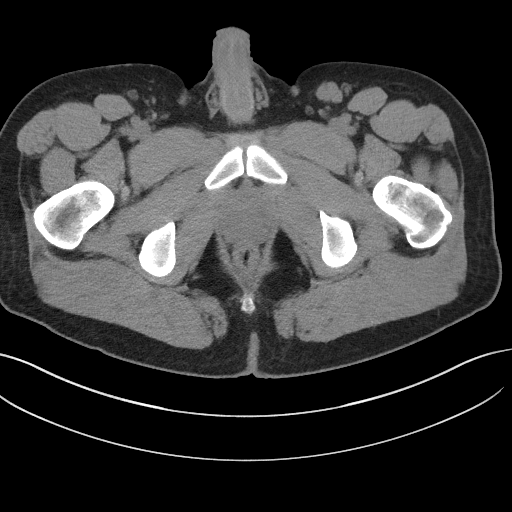
[im 9/110  bone]
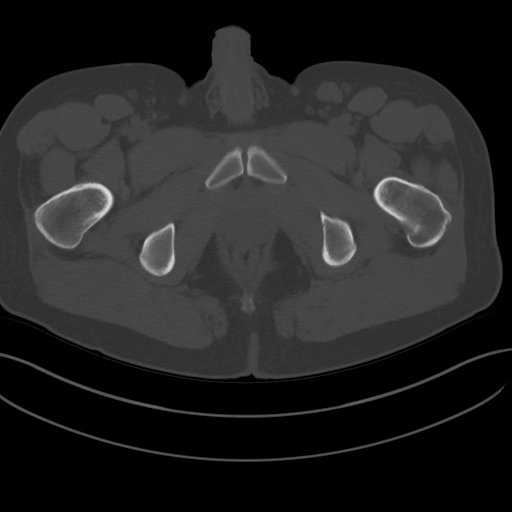
[im 18/110  soft-tissue]
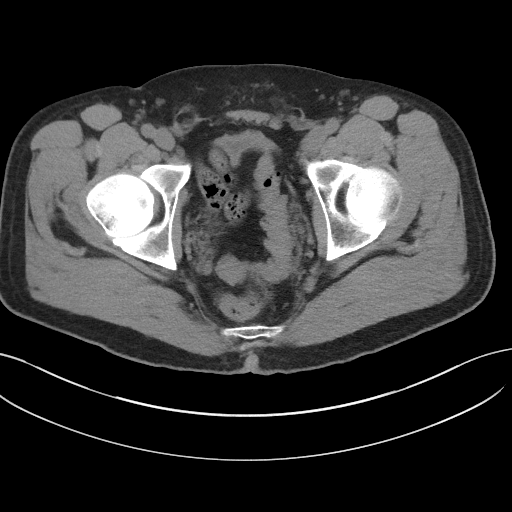
[im 27/110  soft-tissue]
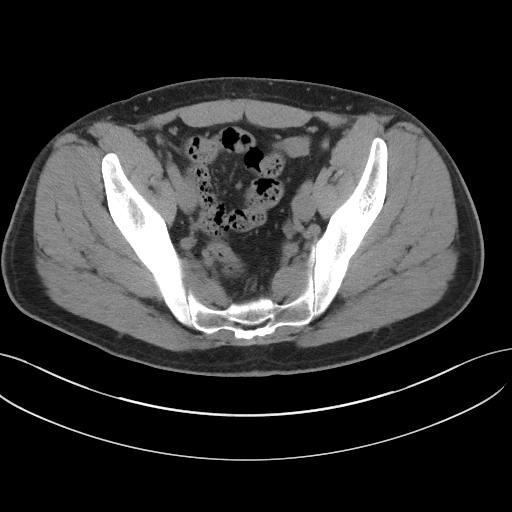
[im 35/110  soft-tissue]
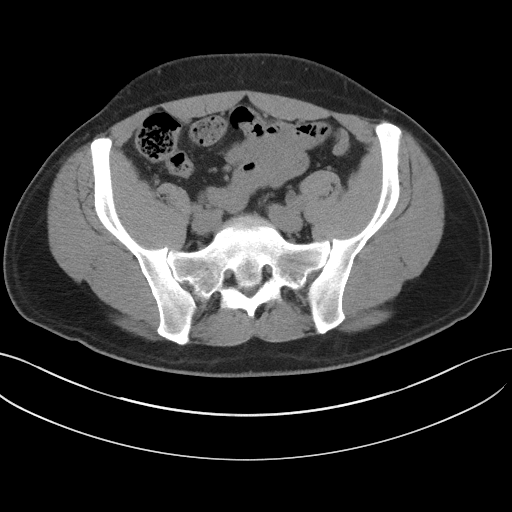
[im 44/110  soft-tissue]
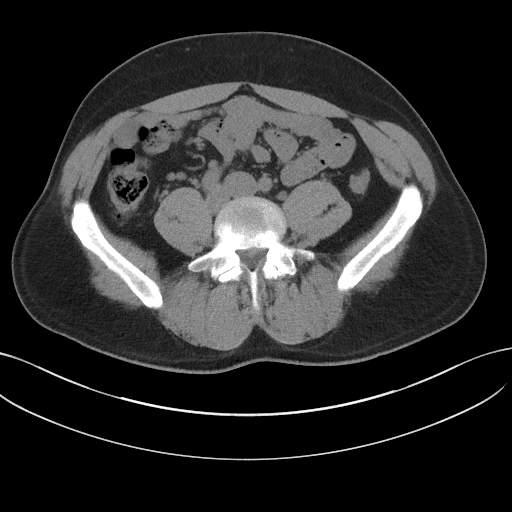
[im 53/110  soft-tissue]
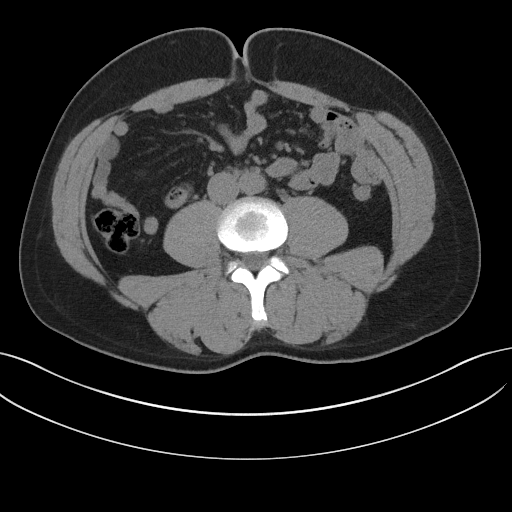
[im 62/110  soft-tissue]
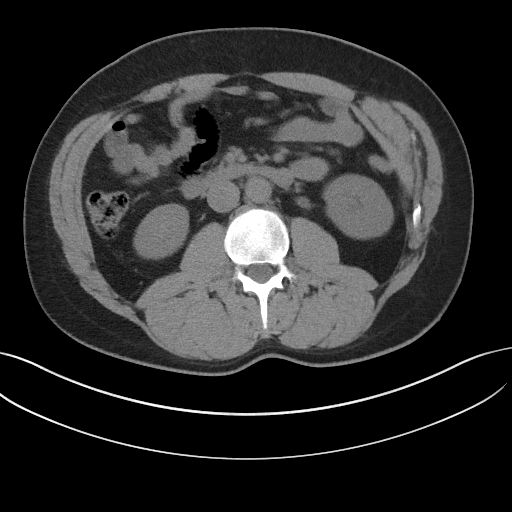
[im 70/110  soft-tissue]
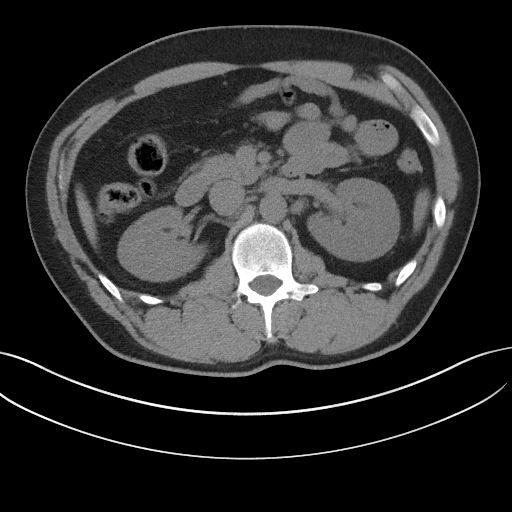
[im 79/110  soft-tissue]
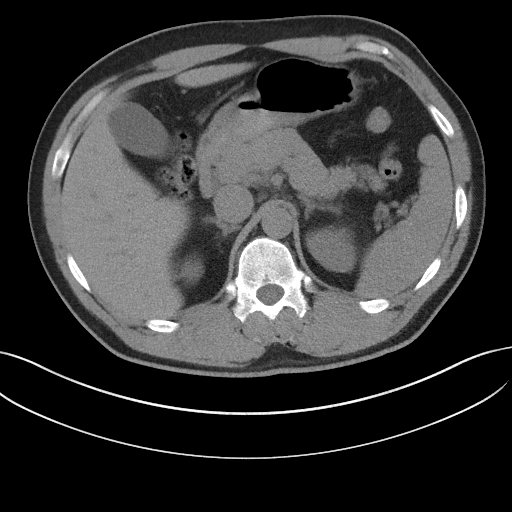
[im 79/110  bone]
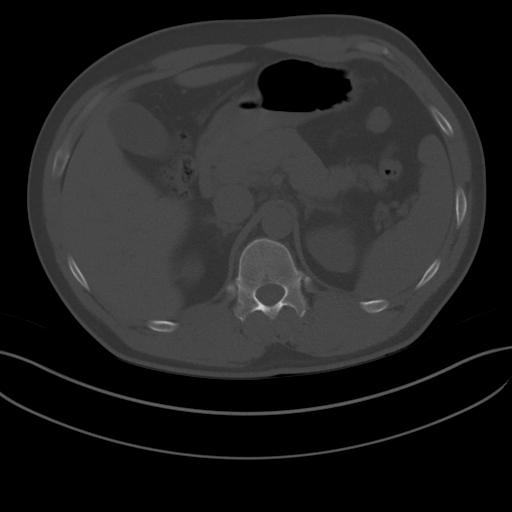
[im 88/110  soft-tissue]
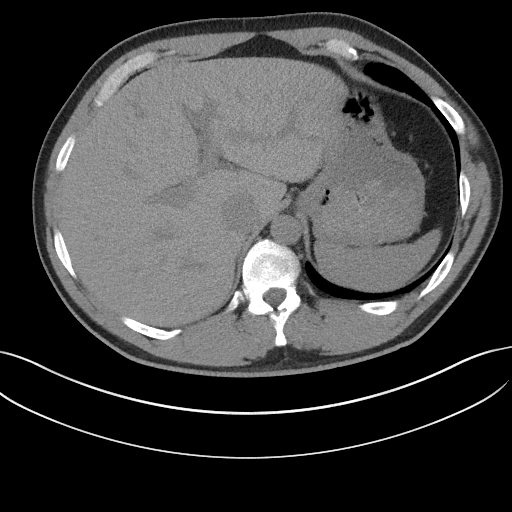
[im 96/110  soft-tissue]
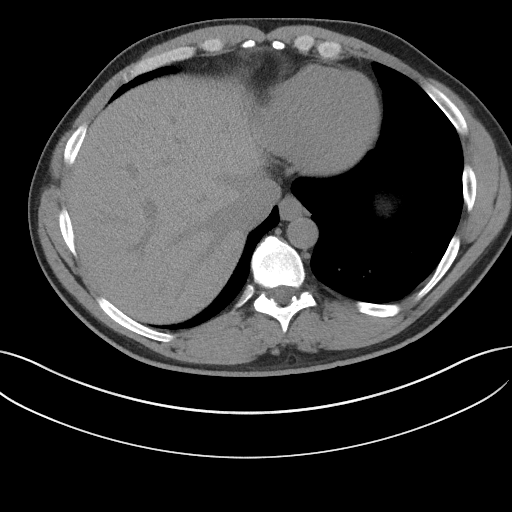
[im 105/110  soft-tissue]
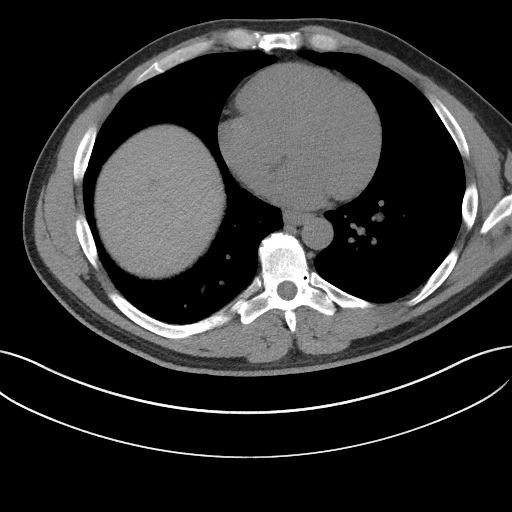

[Series 5: coronal · coronal · 0.71mm/px · 3 of 133 slices shown]
[im 45/133  soft-tissue]
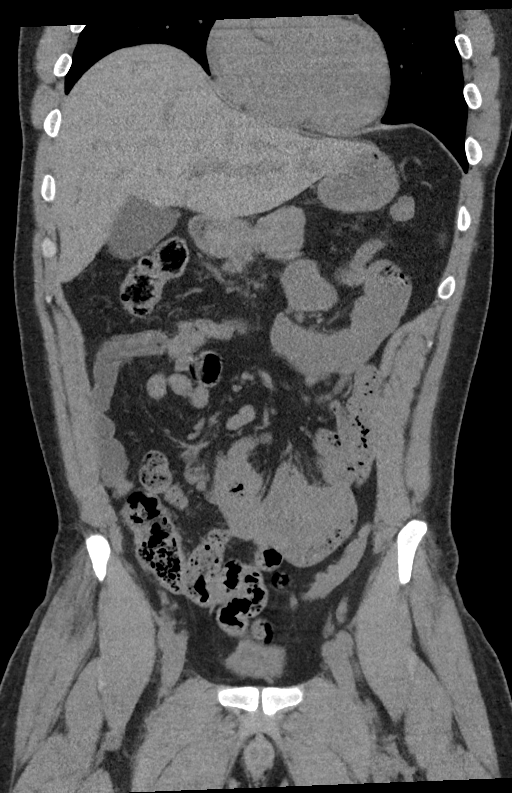
[im 59/133  soft-tissue]
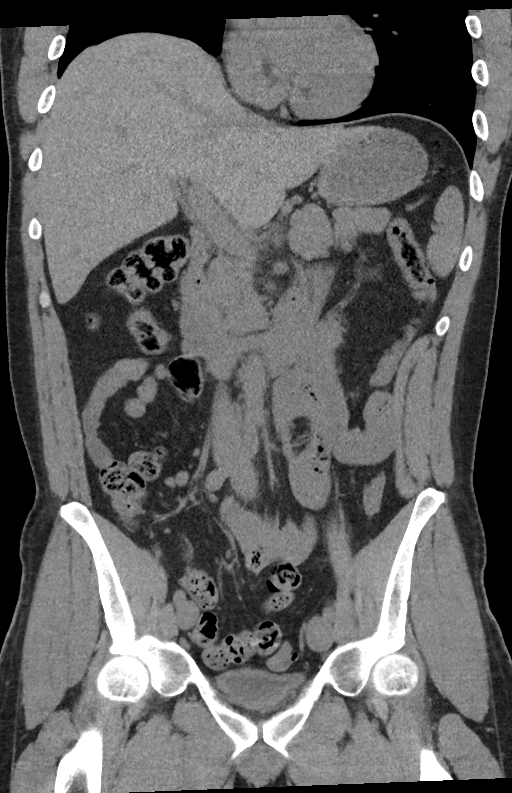
[im 74/133  soft-tissue]
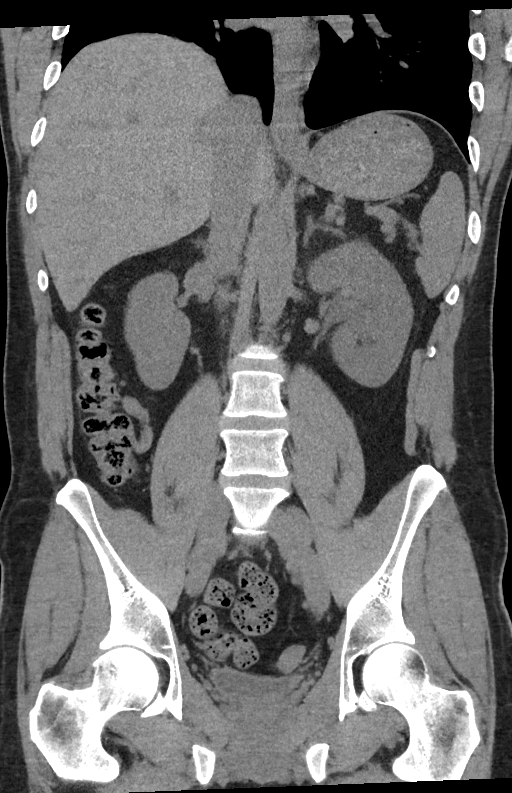

[15 of 46 positions shown; findings below may reference images not displayed]

FINDINGS: Lower chest: Mild elevation of right hemidiaphragm. Dependent
atelectasis in both lungs. No pleural fluid or consolidation.

Hepatobiliary: Subcentimeter hypodensity in the right hepatic lobe,
too small and incompletely characterized on noncontrast exam.
Gallbladder physiologically distended, no calcified stone. No
biliary dilatation.

Pancreas: No ductal dilatation or inflammation.

Spleen: Normal in size without focal abnormality.

Adrenals/Urinary Tract: Normal adrenal glands. Obstructing 3 mm
stone in the distal left ureter just proximal to the
ureterovesicular junction with moderate left hydroureteronephrosis.
Mild left perinephric edema. No right hydronephrosis or hydroureter.
No right perinephric edema. Within the mid left kidney is an 18 mm
lesion slightly hyperdense to adjacent renal parenchyma. Urinary
bladder near completely empty. No bladder wall thickening.

Stomach/Bowel: Stomach is physiologically distended. No bowel wall
thickening, inflammatory change or obstruction. Normal appendix.
Slight fecalization of distal small bowel contents.

Vascular/Lymphatic: Serpiginous vessels/soft tissue density in the
left inguinal region appear contiguous with the left iliac vein.
Mild stranding in the retroperitoneum is nonspecific. No enlarged
abdominal or pelvic lymph nodes. Few multiple prominent central
mesenteric and ileocolic nodes likely reactive.

Reproductive: Prostate is unremarkable.

Other: Fat in both inguinal canals. Small fat containing umbilical
hernia. Tiny fat containing ventral abdominal wall supraumbilical
hernia. No ascites or free air.

Musculoskeletal: Vacuum phenomena at L5-S1. There are no acute or
suspicious osseous abnormalities.
IMPRESSION: 1. Obstructing 3 mm stone in the distal left ureter just proximal to
the ureterovesicular junction with moderate hydronephrosis.
2. Clustered serpiginous soft tissue density in the left inguinal
region likely vascular, may be varicosities but are nonspecific and
suboptimally assessed in the absence of IV contrast. Recommend
correlation with physical exam.

## 2019-12-20 ENCOUNTER — Encounter: Payer: Self-pay | Admitting: Family Medicine

## 2019-12-28 ENCOUNTER — Ambulatory Visit: Payer: Self-pay | Admitting: Family Medicine

## 2020-02-01 ENCOUNTER — Ambulatory Visit: Payer: Self-pay | Admitting: *Deleted

## 2020-02-01 ENCOUNTER — Telehealth (INDEPENDENT_AMBULATORY_CARE_PROVIDER_SITE_OTHER): Payer: Self-pay

## 2020-02-01 NOTE — Telephone Encounter (Signed)
Pt's wife called and left a Message  on the nurses VM saying that the pt's vein in his Lt leg are tight  In the knee cap area and feel like something popped. She would like to know can She put ice on the knee or  What we recommend  To treat .The pt is not in any pain and is not having any swelling , changes in color or heat in the area . The pt did receive foam sclero treatment  back in April for varicose veins. Please advise.

## 2020-02-01 NOTE — Telephone Encounter (Signed)
  Patient's wife is calling- patient reports he heard a pop in his knee and is having swelling and bruising- no pain reported and he is walking on it. Call to office to see if he can be seen tomorrow- they request message sent for PCP review and call back tomorrow am. Patient advised no weight and ice on injury. Patient will await call back for appointment. Best contact: [phone] Reason for Disposition . A "snap" or "pop" was heard at the time of injury  Answer Assessment - Initial Assessment Questions 1. MECHANISM: "How did the injury happen?" (e.g., twisting injury, direct blow)      Patient hurt at work-"pop"  2. ONSET: "When did the injury happen?" (Minutes or hours ago)      today 3. LOCATION: "Where is the injury located?"      R knee 4. APPEARANCE of INJURY: "What does the injury look like?"      Swollen and yellow 5. SEVERITY: "Can you put weight on that leg?" "Can you walk?"      yes 6. SIZE: For cuts, bruises, or swelling, ask: "How large is it?" (e.g., inches or centimeters;  entire joint)      Bruising and swelling- no pain 7. PAIN: "Is there pain?" If Yes, ask: "How bad is the pain?"  "What does it keep you from doing?" (e.g., Scale 1-10; or mild, moderate, severe)   -  NONE: (0): no pain   -  MILD (1-3): doesn't interfere with normal activities    -  MODERATE (4-7): interferes with normal activities (e.g., work or school) or awakens from sleep, limping    -  SEVERE (8-10): excruciating pain, unable to do any normal activities, unable to walk     No pain 8. TETANUS: For any breaks in the skin, ask: "When was the last tetanus booster?"     n/a 9. OTHER SYMPTOMS: "Do you have any other symptoms?"  (e.g., "pop" when knee injured, swelling, locking, buckling)      "pop' sound, swelling 10. PREGNANCY: "Is there any chance you are pregnant?" "When was your last menstrual period?"       n/a  Protocols used: KNEE INJURY-A-AH

## 2020-02-01 NOTE — Telephone Encounter (Signed)
I called and spoke to the pt's wife and made her aware of the Np's instructions.  

## 2020-02-01 NOTE — Telephone Encounter (Signed)
Ice is certainly appropriate.  He can also use OTC NSAIDs like ibuprofen.  It is also possible that something in the knee area has strained.  Typically veins don't pop but ligaments can.  It the patient has swelling in his knee or notes pain or difficulty walking, he should contact his PCP to evaluate the knee area.

## 2020-02-02 ENCOUNTER — Encounter: Payer: Self-pay | Admitting: Family Medicine

## 2020-02-02 ENCOUNTER — Other Ambulatory Visit: Payer: Self-pay

## 2020-02-02 ENCOUNTER — Ambulatory Visit (INDEPENDENT_AMBULATORY_CARE_PROVIDER_SITE_OTHER): Payer: 59 | Admitting: Family Medicine

## 2020-02-02 VITALS — BP 110/82 | HR 60 | Ht 76.0 in | Wt 235.0 lb

## 2020-02-02 DIAGNOSIS — S76111A Strain of right quadriceps muscle, fascia and tendon, initial encounter: Secondary | ICD-10-CM

## 2020-02-02 NOTE — Telephone Encounter (Signed)
Please call Juan Gregory and get him in tomorrow

## 2020-02-02 NOTE — Progress Notes (Signed)
Date:  02/02/2020   Name:  Juan Gregory   DOB:  06-May-1980   MRN:  [id number]   Chief Complaint: Knee Pain (R) knee felt it pop when he was working. swelling has went down, but left a bruise behind)  Knee Pain  The incident occurred 12 to 24 hours ago. The incident occurred at work. Injury mechanism: pushing heavy load/ sudden pop/  bunching of quadracep. The pain is present in the right thigh and right knee. The quality of the pain is described as aching and burning (initial burn). The pain is at a severity of 4/10. The pain is mild. The pain has been fluctuating since onset. Pertinent negatives include no inability to bear weight, loss of motion, loss of sensation, muscle weakness, numbness or tingling. The symptoms are aggravated by movement. He has tried ice and acetaminophen for the symptoms.    Lab Results  Component Value Date   CREATININE 1.03 05/11/2019   BUN 13 05/11/2019   NA 142 05/11/2019   K 4.0 05/11/2019   CL 102 05/11/2019   CO2 22 05/11/2019   Lab Results  Component Value Date   CHOL 191 05/11/2019   HDL 53 05/11/2019   LDLCALC 126 (H) 05/11/2019   TRIG 63 05/11/2019   No results found for: TSH No results found for: HGBA1C Lab Results  Component Value Date   WBC 12.5 (H) 11/24/2017   HGB 15.0 11/24/2017   HCT 42.2 11/24/2017   MCV 96.8 11/24/2017   PLT 157 11/24/2017   Lab Results  Component Value Date   ALT 26 11/24/2017   AST 27 11/24/2017   ALKPHOS 51 11/24/2017   BILITOT 0.6 11/24/2017     Review of Systems  Constitutional: Negative for chills and fever.  HENT: Negative for drooling, ear discharge, ear pain and sore throat.   Respiratory: Negative for cough, shortness of breath and wheezing.   Cardiovascular: Negative for chest pain, palpitations and leg swelling.  Gastrointestinal: Negative for abdominal pain, blood in stool, constipation, diarrhea and nausea.  Endocrine: Negative for polydipsia.  Genitourinary: Negative for  dysuria, frequency, hematuria and urgency.  Musculoskeletal: Negative for back pain, myalgias and neck pain.  Skin: Negative for rash.  Allergic/Immunologic: Negative for environmental allergies.  Neurological: Negative for dizziness, tingling, numbness and headaches.  Hematological: Does not bruise/bleed easily.  Psychiatric/Behavioral: Negative for suicidal ideas. The patient is not nervous/anxious.     Patient Active Problem List   Diagnosis Date Noted  . Varicose veins of bilateral lower extremities with pain 12/06/2018  . Infertility male 05/30/2013    No Known Allergies  Past Surgical History:  Procedure Laterality Date  . VARICOSE VEIN SURGERY Bilateral     Social History   Tobacco Use  . Smoking status: Never Smoker  . Smokeless tobacco: Current User    Types: Snuff  Substance Use Topics  . Alcohol use: Yes    Alcohol/week: 0.0 standard drinks  . Drug use: No     Medication list has been reviewed and updated.  No outpatient medications have been marked as taking for the 02/02/20 encounter (Office Visit) with Juline Patch, MD.    [facility] 2/9 Scores 02/02/2020 05/11/2019 11/15/2018 01/25/2018  PHQ - 2 Score 0 0 0 0  PHQ- 9 Score 0 0 0 1    GAD 7 : Generalized Anxiety Score 02/02/2020 05/11/2019  Nervous, Anxious, on Edge 0 0  Control/stop worrying 0 0  Worry too much - different things 0  0  Trouble relaxing 0 0  Restless 0 0  Easily annoyed or irritable 0 0  Afraid - awful might happen 0 0  Total GAD 7 Score 0 0    BP Readings from Last 3 Encounters:  02/02/20 110/82  07/01/19 120/81  06/10/19 120/71    Physical Exam Vitals and nursing note reviewed.  HENT:     Head: Normocephalic.     Right Ear: External ear normal.     Left Ear: External ear normal.     Nose: Nose normal.  Eyes:     General: No scleral icterus.       Right eye: No discharge.        Left eye: No discharge.     Conjunctiva/sclera: Conjunctivae normal.     Pupils: Pupils are  equal, round, and reactive to light.  Neck:     Thyroid: No thyromegaly.     Vascular: No JVD.     Trachea: No tracheal deviation.  Cardiovascular:     Rate and Rhythm: Normal rate and regular rhythm.     Heart sounds: Normal heart sounds. No murmur heard.  No friction rub. No gallop.   Pulmonary:     Effort: No respiratory distress.     Breath sounds: Normal breath sounds. No wheezing or rales.  Abdominal:     General: Bowel sounds are normal.     Palpations: Abdomen is soft. There is no mass.     Tenderness: There is no abdominal tenderness. There is no guarding or rebound.  Musculoskeletal:        General: Normal range of motion.     Cervical back: Normal range of motion and neck supple.     Right upper leg: Deformity and tenderness present.     Comments: Ecchymosis/palpable avulsed quad  Lymphadenopathy:     Cervical: No cervical adenopathy.  Skin:    General: Skin is warm.     Findings: No rash.  Neurological:     Mental Status: He is alert and oriented to person, place, and time.     Cranial Nerves: No cranial nerve deficit.     Deep Tendon Reflexes: Reflexes are normal and symmetric.     Wt Readings from Last 3 Encounters:  02/02/20 235 lb (106.6 kg)  07/01/19 226 lb (102.5 kg)  06/10/19 231 lb (104.8 kg)    BP 110/82   Pulse 60   Ht 6\' 4"  (1.93 m)   Wt 235 lb (106.6 kg)   BMI 28.61 kg/m   Assessment and Plan: 1. Quadriceps muscle rupture, right, initial encounter New onset acute injury while pushing a heavy load felt a sudden pop discomfort above the right knee medial aspect.  Soft palpable mass above the patella with ecchymosis.  This is consistent with a partial tear of the quadricep proximally with bunching distally.  Will refer to orthopedics for confirmation and evaluation and further treatment if necessary. - Ambulatory referral to Orthopedic Surgery

## 2020-02-02 NOTE — Telephone Encounter (Signed)
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

## 2020-04-03 ENCOUNTER — Other Ambulatory Visit: Payer: Self-pay

## 2020-04-03 ENCOUNTER — Ambulatory Visit (INDEPENDENT_AMBULATORY_CARE_PROVIDER_SITE_OTHER): Payer: 59 | Admitting: Family Medicine

## 2020-04-03 ENCOUNTER — Encounter: Payer: Self-pay | Admitting: Family Medicine

## 2020-04-03 VITALS — BP 130/80 | HR 80 | Temp 98.6°F | Ht 76.0 in | Wt 240.0 lb

## 2020-04-03 DIAGNOSIS — J0100 Acute maxillary sinusitis, unspecified: Secondary | ICD-10-CM | POA: Diagnosis not present

## 2020-04-03 DIAGNOSIS — R059 Cough, unspecified: Secondary | ICD-10-CM

## 2020-04-03 MED ORDER — GUAIFENESIN-CODEINE 100-10 MG/5ML PO SYRP: 5.0000 mL | ORAL_SOLUTION | Freq: Four times a day (QID) | ORAL | 0 refills | Status: DC | PRN

## 2020-04-03 MED ORDER — AMOXICILLIN 500 MG PO CAPS: 500.0000 mg | ORAL_CAPSULE | Freq: Three times a day (TID) | ORAL | 0 refills | Status: DC

## 2020-04-03 NOTE — Progress Notes (Signed)
Date:  04/03/2020   Name:  Juan Gregory   DOB:  [birth year]   MRN:  [id number]   Chief Complaint: Sinusitis (Drainage causing a cough- worse at night- taking otc Tylenol sinus and cold and allergy meds.)  Sinusitis This is a new problem. The current episode started in the past 7 days. The problem has been waxing and waning since onset. There has been no fever. The pain is mild. Associated symptoms include congestion, coughing, headaches, sinus pressure and sneezing. Pertinent negatives include no chills, diaphoresis, ear pain, hoarse voice, neck pain, shortness of breath, sore throat or swollen glands. (Postnasal drainage) Past treatments include oral decongestants.    Lab Results  Component Value Date   CREATININE 1.03 05/11/2019   BUN 13 05/11/2019   NA 142 05/11/2019   K 4.0 05/11/2019   CL 102 05/11/2019   CO2 22 05/11/2019   Lab Results  Component Value Date   CHOL 191 05/11/2019   HDL 53 05/11/2019   LDLCALC 126 (H) 05/11/2019   TRIG 63 05/11/2019   No results found for: TSH No results found for: HGBA1C Lab Results  Component Value Date   WBC 12.5 (H) 11/24/2017   HGB 15.0 11/24/2017   HCT 42.2 11/24/2017   MCV 96.8 11/24/2017   PLT 157 11/24/2017   Lab Results  Component Value Date   ALT 26 11/24/2017   AST 27 11/24/2017   ALKPHOS 51 11/24/2017   BILITOT 0.6 11/24/2017     Review of Systems  Constitutional: Negative for chills, diaphoresis and fever.  HENT: Positive for congestion, sinus pressure and sneezing. Negative for drooling, ear discharge, ear pain, hoarse voice and sore throat.   Respiratory: Positive for cough. Negative for shortness of breath and wheezing.   Cardiovascular: Negative for chest pain, palpitations and leg swelling.  Gastrointestinal: Negative for abdominal pain, blood in stool, constipation, diarrhea and nausea.  Endocrine: Negative for polydipsia.  Genitourinary: Negative for dysuria, frequency, hematuria and urgency.   Musculoskeletal: Negative for back pain, myalgias and neck pain.  Skin: Negative for rash.  Allergic/Immunologic: Negative for environmental allergies.  Neurological: Positive for headaches. Negative for dizziness.  Hematological: Does not bruise/bleed easily.  Psychiatric/Behavioral: Negative for suicidal ideas. The patient is not nervous/anxious.     Patient Active Problem List   Diagnosis Date Noted  . Varicose veins of bilateral lower extremities with pain 12/06/2018  . Infertility male 05/30/2013    No Known Allergies  Past Surgical History:  Procedure Laterality Date  . VARICOSE VEIN SURGERY Bilateral     Social History   Tobacco Use  . Smoking status: Never Smoker  . Smokeless tobacco: Current User    Types: Snuff  Substance Use Topics  . Alcohol use: Yes    Alcohol/week: 0.0 standard drinks  . Drug use: No     Medication list has been reviewed and updated.  No outpatient medications have been marked as taking for the 04/03/20 encounter (Office Visit) with Juline Patch, MD.    [facility] 2/9 Scores 02/02/2020 05/11/2019 11/15/2018 01/25/2018  PHQ - 2 Score 0 0 0 0  PHQ- 9 Score 0 0 0 1    GAD 7 : Generalized Anxiety Score 02/02/2020 05/11/2019  Nervous, Anxious, on Edge 0 0  Control/stop worrying 0 0  Worry too much - different things 0 0  Trouble relaxing 0 0  Restless 0 0  Easily annoyed or irritable 0 0  Afraid - awful might happen 0 0  Total GAD 7 Score 0 0    BP Readings from Last 3 Encounters:  04/03/20 130/80  02/02/20 110/82  07/01/19 120/81    Physical Exam Vitals and nursing note reviewed.  HENT:     Head: Normocephalic.     Jaw: There is normal jaw occlusion.     Right Ear: Hearing, tympanic membrane and external ear normal.     Left Ear: Hearing, tympanic membrane and external ear normal.     Nose: Nose normal.     Right Turbinates: Swollen.     Left Turbinates: Swollen.     Right Sinus: No maxillary sinus tenderness or frontal sinus  tenderness.     Left Sinus: No maxillary sinus tenderness or frontal sinus tenderness.     Mouth/Throat:     Lips: Pink.     Mouth: Oropharynx is clear and moist. Mucous membranes are moist.     Pharynx: Oropharynx is clear. Uvula midline.     Tonsils: No tonsillar exudate.  Eyes:     General: No scleral icterus.       Right eye: No discharge.        Left eye: No discharge.     Extraocular Movements: EOM normal.     Conjunctiva/sclera: Conjunctivae normal.     Pupils: Pupils are equal, round, and reactive to light.  Neck:     Thyroid: No thyromegaly.     Vascular: No JVD.     Trachea: No tracheal deviation.  Cardiovascular:     Rate and Rhythm: Normal rate and regular rhythm.     Pulses: Intact distal pulses.     Heart sounds: Normal heart sounds. No murmur heard. No friction rub. No gallop.   Pulmonary:     Effort: No respiratory distress.     Breath sounds: Normal breath sounds. No wheezing, rhonchi or rales.  Abdominal:     General: Bowel sounds are normal.     Palpations: Abdomen is soft. There is no hepatosplenomegaly or mass.     Tenderness: There is no abdominal tenderness. There is no CVA tenderness, guarding or rebound.  Musculoskeletal:        General: No tenderness or edema. Normal range of motion.     Cervical back: Normal range of motion and neck supple.  Lymphadenopathy:     Cervical: No cervical adenopathy.  Skin:    General: Skin is warm.     Findings: No rash.  Neurological:     Mental Status: He is alert and oriented to person, place, and time.     Cranial Nerves: No cranial nerve deficit.     Deep Tendon Reflexes: Strength normal and reflexes are normal and symmetric.     Wt Readings from Last 3 Encounters:  04/03/20 240 lb (108.9 kg)  02/02/20 235 lb (106.6 kg)  07/01/19 226 lb (102.5 kg)    BP 130/80   Pulse 80   Temp 98.6 F (37 C) (Oral)   Ht 6\' 4"  (1.93 m)   Wt 240 lb (108.9 kg)   BMI 29.21 kg/m   Assessment and Plan: 1. Acute  non-recurrent maxillary sinusitis New onset.  Persistent.  Stable.  Exam and history is consistent with maxillary sinusitis.  Will check amoxicillin 500 mg 3 times a day for 10 days. - amoxicillin (AMOXIL) 500 MG capsule; Take 1 capsule (500 mg total) by mouth 3 (three) times daily.  Dispense: 30 capsule; Refill: 0  2. Cough New onset.  Except episodic.  Particularly at night  patient is having issues with cough when he is lying supine.  Will start Robitussin-AC 1 teaspoon every 6 hours for cough. - guaiFENesin-codeine (ROBITUSSIN AC) 100-10 MG/5ML syrup; Take 5 mLs by mouth 4 (four) times daily as needed for cough.  Dispense: 118 mL; Refill: 0

## 2020-04-27 ENCOUNTER — Telehealth: Payer: Self-pay | Admitting: Family Medicine

## 2020-04-27 NOTE — Telephone Encounter (Signed)
Pt had appt 04/03/20 and was prescribed cough medicine w/ codeine in it. 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  Test came back positive for codeine.  Pt needs a letter from the dr stating she prescribed this mediation, and pt is not taking codeine. Ok to put on [employer].

## 2020-04-27 NOTE — Telephone Encounter (Signed)
Please call pt to pick up letter

## 2020-05-11 ENCOUNTER — Other Ambulatory Visit: Payer: Self-pay

## 2020-05-11 ENCOUNTER — Ambulatory Visit (INDEPENDENT_AMBULATORY_CARE_PROVIDER_SITE_OTHER): Payer: 59 | Admitting: Family Medicine

## 2020-05-11 ENCOUNTER — Encounter: Payer: Self-pay | Admitting: Family Medicine

## 2020-05-11 VITALS — BP 110/80 | HR 72 | Ht 76.0 in | Wt 235.0 lb

## 2020-05-11 DIAGNOSIS — Z0000 Encounter for general adult medical examination without abnormal findings: Secondary | ICD-10-CM

## 2020-05-11 DIAGNOSIS — Z6828 Body mass index (BMI) 28.0-28.9, adult: Secondary | ICD-10-CM

## 2020-05-11 NOTE — Progress Notes (Signed)
Date:  05/11/2020   Name:  Juan Gregory   DOB:  06-09-1980   MRN:  [id number]   Chief Complaint: Annual Exam  Patient is a 40 year old male who presents for a comprehensive physical exam. The patient reports the following problems: none. Health maintenance has been reviewed up to date.   Lab Results  Component Value Date   CREATININE 1.03 05/11/2019   BUN 13 05/11/2019   NA 142 05/11/2019   K 4.0 05/11/2019   CL 102 05/11/2019   CO2 22 05/11/2019   Lab Results  Component Value Date   CHOL 191 05/11/2019   HDL 53 05/11/2019   LDLCALC 126 (H) 05/11/2019   TRIG 63 05/11/2019   No results found for: TSH No results found for: HGBA1C Lab Results  Component Value Date   WBC 12.5 (H) 11/24/2017   HGB 15.0 11/24/2017   HCT 42.2 11/24/2017   MCV 96.8 11/24/2017   PLT 157 11/24/2017   Lab Results  Component Value Date   ALT 26 11/24/2017   AST 27 11/24/2017   ALKPHOS 51 11/24/2017   BILITOT 0.6 11/24/2017     Review of Systems  Constitutional: Negative for chills and fever.  HENT: Negative for drooling, ear discharge, ear pain and sore throat.   Respiratory: Negative for cough, shortness of breath and wheezing.   Cardiovascular: Negative for chest pain, palpitations and leg swelling.  Gastrointestinal: Negative for abdominal pain, blood in stool, constipation, diarrhea and nausea.  Endocrine: Negative for polydipsia.  Genitourinary: Negative for dysuria, frequency, hematuria and urgency.  Musculoskeletal: Negative for back pain, myalgias and neck pain.  Skin: Negative for rash.  Allergic/Immunologic: Negative for environmental allergies.  Neurological: Negative for dizziness and headaches.  Hematological: Does not bruise/bleed easily.  Psychiatric/Behavioral: Negative for suicidal ideas. The patient is not nervous/anxious.     Patient Active Problem List   Diagnosis Date Noted  . Varicose veins of bilateral lower extremities with pain 12/06/2018  .  Infertility male 05/30/2013    No Known Allergies  Past Surgical History:  Procedure Laterality Date  . VARICOSE VEIN SURGERY Bilateral     Social History   Tobacco Use  . Smoking status: Never Smoker  . Smokeless tobacco: Current User    Types: Snuff  Substance Use Topics  . Alcohol use: Yes    Alcohol/week: 0.0 standard drinks  . Drug use: No     Medication list has been reviewed and updated.  No outpatient medications have been marked as taking for the 05/11/20 encounter (Office Visit) with Juline Patch, MD.    [facility] 2/9 Scores 02/02/2020 05/11/2019 11/15/2018 01/25/2018  PHQ - 2 Score 0 0 0 0  PHQ- 9 Score 0 0 0 1    GAD 7 : Generalized Anxiety Score 02/02/2020 05/11/2019  Nervous, Anxious, on Edge 0 0  Control/stop worrying 0 0  Worry too much - different things 0 0  Trouble relaxing 0 0  Restless 0 0  Easily annoyed or irritable 0 0  Afraid - awful might happen 0 0  Total GAD 7 Score 0 0    BP Readings from Last 3 Encounters:  05/11/20 110/80  04/03/20 130/80  02/02/20 110/82    Physical Exam Vitals and nursing note reviewed.  Constitutional:      Appearance: Normal appearance. He is well-developed and well-groomed.  HENT:     Head: Normocephalic.     Jaw: There is normal jaw occlusion.  Right Ear: Hearing, tympanic membrane, ear canal and external ear normal. There is no impacted cerumen.     Left Ear: Hearing, tympanic membrane, ear canal and external ear normal. There is no impacted cerumen.     Nose: Nose normal. No congestion or rhinorrhea.     Mouth/Throat:     Lips: Pink.     Mouth: Oropharynx is clear and moist. Mucous membranes are moist.     Tongue: No lesions.     Pharynx: Oropharynx is clear. Uvula midline.     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 Vision grossly intact. Gaze aligned appropriately. No scleral icterus.       Right eye: No discharge.        Left eye: No discharge.      Extraocular Movements: Extraocular movements intact and EOM normal.     Conjunctiva/sclera: Conjunctivae normal.     Right eye: Right conjunctiva is not injected.     Left eye: Left conjunctiva is not injected.     Pupils: Pupils are equal, round, and reactive to light.     Funduscopic exam:    Right eye: Red reflex present.        Left eye: Red reflex present. Neck:     Thyroid: No thyroid mass, thyromegaly or thyroid tenderness.     Vascular: Normal carotid pulses. No carotid bruit, hepatojugular reflux or JVD.     Trachea: Trachea normal. No tracheal deviation.  Cardiovascular:     Rate and Rhythm: Normal rate and regular rhythm.     Chest Wall: PMI is not displaced. No thrill.     Pulses: Normal pulses and intact distal pulses.          Carotid pulses are 2+ on the right side and 2+ on the left side.      Radial pulses are 2+ on the right side and 2+ on the left side.       Femoral pulses are 2+ on the right side and 2+ on the left side.      Popliteal pulses are 2+ on the right side and 2+ on the left side.       Dorsalis pedis pulses are 2+ on the right side and 2+ on the left side.       Posterior tibial pulses are 2+ on the right side and 2+ on the left side.     Heart sounds: Normal heart sounds, S1 normal and S2 normal. No murmur heard.  No systolic murmur is present.  No diastolic murmur is present. No friction rub. No gallop. No S3 or S4 sounds.   Pulmonary:     Effort: Pulmonary effort is normal. No respiratory distress.     Breath sounds: Normal breath sounds. No decreased breath sounds, wheezing, rhonchi or rales.  Chest:     Chest wall: No mass, tenderness or edema.  Breasts: Breasts are symmetrical.     Right: Normal. No swelling, bleeding, inverted nipple, mass, nipple discharge, skin change, tenderness, axillary adenopathy or supraclavicular adenopathy.     Left: Normal. No swelling, bleeding, inverted nipple, mass, nipple discharge, skin change, tenderness,  axillary adenopathy or supraclavicular adenopathy.    Abdominal:     General: Bowel sounds are normal.     Palpations: Abdomen is soft. There is no hepatomegaly, splenomegaly, hepatosplenomegaly or mass.     Tenderness: There is no abdominal tenderness. There is no CVA tenderness, right CVA tenderness, left CVA  tenderness, guarding or rebound.  Genitourinary:    Penis: Normal.      Testes: Normal.        Right: Mass, tenderness or swelling not present.        Left: Mass, tenderness or swelling not present.     Epididymis:     Right: Normal.     Left: Normal.  Musculoskeletal:        General: No tenderness or edema. Normal range of motion.     Cervical back: Normal, full passive range of motion without pain, normal range of motion and neck supple.     Thoracic back: Normal.     Lumbar back: Normal.     Right lower leg: No edema.     Left lower leg: No edema.  Lymphadenopathy:     Head:     Right side of head: No submandibular or tonsillar adenopathy.     Left side of head: No submandibular or tonsillar adenopathy.     Cervical: No cervical adenopathy.     Right cervical: No superficial, deep or posterior cervical adenopathy.    Left cervical: No superficial, deep or posterior cervical adenopathy.     Upper Body:     Right upper body: No supraclavicular or axillary adenopathy.     Left upper body: No supraclavicular or axillary adenopathy.  Skin:    General: Skin is warm.     Capillary Refill: Capillary refill takes less than 2 seconds.     Findings: No rash.  Neurological:     General: No focal deficit present.     Mental Status: He is alert and oriented to person, place, and time.     Cranial Nerves: Cranial nerves are intact. No cranial nerve deficit.     Sensory: Sensation is intact.     Motor: Motor function is intact.     Deep Tendon Reflexes: Strength normal and reflexes are normal and symmetric.  Psychiatric:        Behavior: Behavior is cooperative.     Wt  Readings from Last 3 Encounters:  05/11/20 235 lb (106.6 kg)  04/03/20 240 lb (108.9 kg)  02/02/20 235 lb (106.6 kg)    BP 110/80   Pulse 72   Ht 6\' 4"  (1.93 m)   Wt 235 lb (106.6 kg)   BMI 28.61 kg/m   Assessment and Plan:  1. Annual physical exam No subjective/objective concerns noted during history and physical exam.Heinz E Apple is a 40 y.o. male who presents today for his Complete Annual Exam. He feels well. He reports exercising . He reports he is sleeping well. Immunizations are reviewed and recommendations provided.   Age appropriate screening tests are discussed. Counseling given for risk factor reduction interventions.  We will obtain a CMP, lipid panel and CBC. - Comprehensive metabolic panel - Lipid Panel With LDL/HDL Ratio - CBC with Differential/Platelet  2. BMI 28.0-28.9,adult Patient is not an overweight but he is very muscular in his BMI secondary to lean body weight and not adipose nature.  However we will check a lipid panel to verify that lipid status is stable. - Lipid Panel With LDL/HDL Ratio

## 2020-05-23 ENCOUNTER — Other Ambulatory Visit: Payer: Self-pay

## 2020-05-23 DIAGNOSIS — Z0000 Encounter for general adult medical examination without abnormal findings: Secondary | ICD-10-CM

## 2020-05-23 DIAGNOSIS — Z1211 Encounter for screening for malignant neoplasm of colon: Secondary | ICD-10-CM

## 2020-05-23 DIAGNOSIS — Z6828 Body mass index (BMI) 28.0-28.9, adult: Secondary | ICD-10-CM

## 2020-05-24 LAB — COMPREHENSIVE METABOLIC PANEL
ALT: 30 IU/L (ref 0–44)
AST: 29 IU/L (ref 0–40)
Albumin/Globulin Ratio: 1.9 (ref 1.2–2.2)
Albumin: 4.8 g/dL (ref 4.0–5.0)
Alkaline Phosphatase: 71 IU/L (ref 44–121)
BUN/Creatinine Ratio: 14 (ref 9–20)
BUN: 14 mg/dL (ref 6–24)
Bilirubin Total: 0.4 mg/dL (ref 0.0–1.2)
CO2: 22 mmol/L (ref 20–29)
Calcium: 9.2 mg/dL (ref 8.7–10.2)
Chloride: 104 mmol/L (ref 96–106)
Creatinine, Ser: 1.03 mg/dL (ref 0.76–1.27)
Globulin, Total: 2.5 g/dL (ref 1.5–4.5)
Glucose: 91 mg/dL (ref 65–99)
Potassium: 4.3 mmol/L (ref 3.5–5.2)
Sodium: 141 mmol/L (ref 134–144)
Total Protein: 7.3 g/dL (ref 6.0–8.5)
eGFR: 94 mL/min/{1.73_m2} (ref >59)

## 2020-05-24 LAB — LIPID PANEL WITH LDL/HDL RATIO
Cholesterol, Total: 168 mg/dL (ref 100–199)
HDL: 42 mg/dL (ref >39)
LDL Chol Calc (NIH): 116 mg/dL — ABNORMAL HIGH (ref 0–99)
LDL/HDL Ratio: 2.8 ratio (ref 0.0–3.6)
Triglycerides: 50 mg/dL (ref 0–149)
VLDL Cholesterol Cal: 10 mg/dL (ref 5–40)

## 2020-05-24 LAB — CBC WITH DIFFERENTIAL/PLATELET
Basophils Absolute: 0 10*3/uL (ref 0.0–0.2)
Basos: 1 %
EOS (ABSOLUTE): 0.2 10*3/uL (ref 0.0–0.4)
Eos: 3 %
Hematocrit: 42.2 % (ref 37.5–51.0)
Hemoglobin: 14.8 g/dL (ref 13.0–17.7)
Immature Grans (Abs): 0 10*3/uL (ref 0.0–0.1)
Immature Granulocytes: 0 %
Lymphocytes Absolute: 2.1 10*3/uL (ref 0.7–3.1)
Lymphs: 41 %
MCH: 33.1 pg — ABNORMAL HIGH (ref 26.6–33.0)
MCHC: 35.1 g/dL (ref 31.5–35.7)
MCV: 94 fL (ref 79–97)
Monocytes Absolute: 0.6 10*3/uL (ref 0.1–0.9)
Monocytes: 12 %
Neutrophils Absolute: 2.2 10*3/uL (ref 1.4–7.0)
Neutrophils: 43 %
Platelets: 142 10*3/uL — ABNORMAL LOW (ref 150–450)
RBC: 4.47 x10E6/uL (ref 4.14–5.80)
RDW: 12.1 % (ref 11.6–15.4)
WBC: 5.1 10*3/uL (ref 3.4–10.8)

## 2020-08-17 ENCOUNTER — Other Ambulatory Visit: Payer: Self-pay

## 2020-08-17 ENCOUNTER — Ambulatory Visit (INDEPENDENT_AMBULATORY_CARE_PROVIDER_SITE_OTHER): Payer: 59 | Admitting: Family Medicine

## 2020-08-17 ENCOUNTER — Encounter: Payer: Self-pay | Admitting: Family Medicine

## 2020-08-17 VITALS — BP 120/80 | HR 80 | Ht 76.0 in | Wt 231.0 lb

## 2020-08-17 DIAGNOSIS — J0100 Acute maxillary sinusitis, unspecified: Secondary | ICD-10-CM | POA: Diagnosis not present

## 2020-08-17 DIAGNOSIS — J309 Allergic rhinitis, unspecified: Secondary | ICD-10-CM | POA: Diagnosis not present

## 2020-08-17 DIAGNOSIS — H6123 Impacted cerumen, bilateral: Secondary | ICD-10-CM

## 2020-08-17 MED ORDER — CARBAMIDE PEROXIDE 6.5 % OT SOLN: 5.0000 [drp] | Freq: Two times a day (BID) | OTIC | 0 refills | Status: DC

## 2020-08-17 MED ORDER — AMOXICILLIN-POT CLAVULANATE 875-125 MG PO TABS: 1.0000 | ORAL_TABLET | Freq: Two times a day (BID) | ORAL | 0 refills | Status: DC

## 2020-08-17 MED ORDER — MONTELUKAST SODIUM 10 MG PO TABS: 10.0000 mg | ORAL_TABLET | Freq: Every day | ORAL | 3 refills | Status: DC

## 2020-08-17 NOTE — Patient Instructions (Signed)

## 2020-08-17 NOTE — Progress Notes (Signed)
Date:  08/17/2020   Name:  Juan Gregory   DOB:  08/19/1980   MRN:  [id number]   Chief Complaint: Sinusitis (Cough- in the am, drainage from nose, sinus pressure)  Sinusitis This is a new problem. The current episode started in the past 7 days. The problem has been gradually worsening since onset. There has been no fever. The pain is mild. Associated symptoms include congestion, coughing, headaches, sinus pressure, sneezing and a sore throat. Pertinent negatives include no chills, diaphoresis, ear pain, hoarse voice, neck pain, shortness of breath or swollen glands. Past treatments include oral decongestants. The treatment provided moderate relief.    Lab Results  Component Value Date   CREATININE 1.03 05/23/2020   BUN 14 05/23/2020   NA 141 05/23/2020   K 4.3 05/23/2020   CL 104 05/23/2020   CO2 22 05/23/2020   Lab Results  Component Value Date   CHOL 168 05/23/2020   HDL 42 05/23/2020   LDLCALC 116 (H) 05/23/2020   TRIG 50 05/23/2020   No results found for: TSH No results found for: HGBA1C Lab Results  Component Value Date   WBC 5.1 05/23/2020   HGB 14.8 05/23/2020   HCT 42.2 05/23/2020   MCV 94 05/23/2020   PLT 142 (L) 05/23/2020   Lab Results  Component Value Date   ALT 30 05/23/2020   AST 29 05/23/2020   ALKPHOS 71 05/23/2020   BILITOT 0.4 05/23/2020     Review of Systems  Constitutional: Negative for chills, diaphoresis and fever.  HENT: Positive for congestion, sinus pressure, sneezing and sore throat. Negative for drooling, ear discharge, ear pain and hoarse voice.   Respiratory: Positive for cough. Negative for shortness of breath and wheezing.   Cardiovascular: Negative for chest pain, palpitations and leg swelling.  Gastrointestinal: Negative for abdominal pain, blood in stool, constipation, diarrhea and nausea.  Endocrine: Negative for polydipsia.  Genitourinary: Negative for dysuria, frequency, hematuria and urgency.  Musculoskeletal: Negative  for back pain, myalgias and neck pain.  Skin: Negative for rash.  Allergic/Immunologic: Negative for environmental allergies.  Neurological: Positive for headaches. Negative for dizziness.  Hematological: Does not bruise/bleed easily.  Psychiatric/Behavioral: Negative for suicidal ideas. The patient is not nervous/anxious.     Patient Active Problem List   Diagnosis Date Noted  . Varicose veins of bilateral lower extremities with pain 12/06/2018  . Infertility male 05/30/2013    No Known Allergies  Past Surgical History:  Procedure Laterality Date  . VARICOSE VEIN SURGERY Bilateral     Social History   Tobacco Use  . Smoking status: Never Smoker  . Smokeless tobacco: Current User    Types: Snuff  Substance Use Topics  . Alcohol use: Yes    Alcohol/week: 0.0 standard drinks  . Drug use: No     Medication list has been reviewed and updated.  No outpatient medications have been marked as taking for the 08/17/20 encounter (Office Visit) with Juline Patch, MD.    [facility] 2/9 Scores 08/17/2020 02/02/2020 05/11/2019 11/15/2018  PHQ - 2 Score 0 0 0 0  PHQ- 9 Score 0 0 0 0    GAD 7 : Generalized Anxiety Score 08/17/2020 02/02/2020 05/11/2019  Nervous, Anxious, on Edge 0 0 0  Control/stop worrying 0 0 0  Worry too much - different things 0 0 0  Trouble relaxing 0 0 0  Restless 0 0 0  Easily annoyed or irritable 0 0 0  Afraid - awful might happen  0 0 0  Total GAD 7 Score 0 0 0    BP Readings from Last 3 Encounters:  08/17/20 120/80  05/11/20 110/80  04/03/20 130/80    Physical Exam Vitals reviewed.  HENT:     Head: Normocephalic.     Right Ear: Tympanic membrane and external ear normal.     Left Ear: Tympanic membrane and external ear normal.     Nose: Nose normal. No congestion or rhinorrhea.     Mouth/Throat:     Mouth: Mucous membranes are moist.     Pharynx: No oropharyngeal exudate or posterior oropharyngeal erythema.  Eyes:     General: No scleral icterus.        Right eye: No discharge.        Left eye: No discharge.     Conjunctiva/sclera: Conjunctivae normal.     Pupils: Pupils are equal, round, and reactive to light.  Neck:     Thyroid: No thyromegaly.     Vascular: No JVD.     Trachea: No tracheal deviation.  Cardiovascular:     Rate and Rhythm: Normal rate and regular rhythm.     Heart sounds: Normal heart sounds. No murmur heard. No friction rub. No gallop.   Pulmonary:     Effort: No respiratory distress.     Breath sounds: Normal breath sounds. No wheezing, rhonchi or rales.  Abdominal:     General: Bowel sounds are normal.     Palpations: Abdomen is soft. There is no mass.     Tenderness: There is no abdominal tenderness. There is no guarding or rebound.  Musculoskeletal:        General: No tenderness. Normal range of motion.     Cervical back: Normal range of motion and neck supple.  Lymphadenopathy:     Cervical: No cervical adenopathy.  Skin:    General: Skin is warm.     Findings: No rash.  Neurological:     Mental Status: He is alert and oriented to person, place, and time.     Cranial Nerves: No cranial nerve deficit.     Deep Tendon Reflexes: Reflexes are normal and symmetric.     Wt Readings from Last 3 Encounters:  08/17/20 231 lb (104.8 kg)  05/11/20 235 lb (106.6 kg)  04/03/20 240 lb (108.9 kg)    BP 120/80   Pulse 80   Ht 6\' 4"  (1.93 m)   Wt 231 lb (104.8 kg)   BMI 28.12 kg/m   Assessment and Plan: 1. Acute non-recurrent maxillary sinusitis Acute.  Episodic.  Partially treated because patient has some amoxicillin he had leftover and has only had a partial treatment at this point.  We will continue with Augmentin 875 mg twice a day.  As well as Singulair 10 mg once a day. - amoxicillin-clavulanate (AUGMENTIN) 875-125 MG tablet; Take 1 tablet by mouth 2 (two) times daily.  Dispense: 20 tablet; Refill: 0 - montelukast (SINGULAIR) 10 MG tablet; Take 1 tablet (10 mg total) by mouth at bedtime.   Dispense: 30 tablet; Refill: 3  2. Bilateral impacted cerumen New onset.  Bilateral cerumen impaction right greater than left.  We have called and Debrox otic solution to be used in both ears followed by bulb syringe lavage in the shower. - carbamide peroxide (DEBROX) 6.5 % OTIC solution; Place 5 drops into both ears 2 (two) times daily.  Dispense: 15 mL; Refill: 0  3. Allergic rhinitis, unspecified seasonality, unspecified trigger Episodic.  Stable.  New  onset worsened because of the pollen status.  Will include some Singulair 10 mg once a day at bedtime. - montelukast (SINGULAIR) 10 MG tablet; Take 1 tablet (10 mg total) by mouth at bedtime.  Dispense: 30 tablet; Refill: 3

## 2020-12-04 LAB — CBC AND DIFFERENTIAL
HCT: 43 (ref 41–53)
Hemoglobin: 14.7 (ref 13.5–17.5)
Platelets: 130 — AB (ref 150–399)
WBC: 7

## 2020-12-04 LAB — CBC: RBC: 4.4 (ref 3.87–5.11)

## 2021-01-23 ENCOUNTER — Other Ambulatory Visit: Payer: Self-pay

## 2021-01-23 ENCOUNTER — Encounter: Payer: Self-pay | Admitting: Family Medicine

## 2021-01-23 ENCOUNTER — Ambulatory Visit (INDEPENDENT_AMBULATORY_CARE_PROVIDER_SITE_OTHER): Payer: 59 | Admitting: Family Medicine

## 2021-01-23 VITALS — BP 120/70 | HR 72 | Ht 76.0 in | Wt 241.0 lb

## 2021-01-23 DIAGNOSIS — R599 Enlarged lymph nodes, unspecified: Secondary | ICD-10-CM

## 2021-01-23 DIAGNOSIS — D696 Thrombocytopenia, unspecified: Secondary | ICD-10-CM | POA: Diagnosis not present

## 2021-01-23 NOTE — Progress Notes (Signed)
Date:  01/23/2021   Name:  Juan Gregory   DOB:  [birth year]   MRN:  [id number]   Chief Complaint: Labs Only (Needs cbc repeated- WBC and platelets were elevated at fertility clinic)  Patient is a 40 year old male who presents for an abnormal lab exam. The patient reports the following problems: thrombocytopenia. Health maintenance has been reviewed up to date.     Lab Results  Component Value Date   CREATININE 1.03 05/23/2020   BUN 14 05/23/2020   NA 141 05/23/2020   K 4.3 05/23/2020   CL 104 05/23/2020   CO2 22 05/23/2020   Lab Results  Component Value Date   CHOL 168 05/23/2020   HDL 42 05/23/2020   LDLCALC 116 (H) 05/23/2020   TRIG 50 05/23/2020   No results found for: TSH No results found for: HGBA1C Lab Results  Component Value Date   WBC 7.0 12/04/2020   HGB 14.7 12/04/2020   HCT 43 12/04/2020   MCV 94 05/23/2020   PLT 130 (A) 12/04/2020   Lab Results  Component Value Date   ALT 30 05/23/2020   AST 29 05/23/2020   ALKPHOS 71 05/23/2020   BILITOT 0.4 05/23/2020     Review of Systems  Constitutional:  Negative for chills, fatigue, fever and unexpected weight change.  HENT:  Negative for drooling, ear discharge, ear pain and sore throat.   Respiratory:  Negative for cough, shortness of breath and wheezing.   Cardiovascular:  Negative for chest pain, palpitations and leg swelling.  Gastrointestinal:  Negative for abdominal pain, blood in stool, constipation, diarrhea and nausea.  Endocrine: Negative for polydipsia.  Genitourinary:  Negative for dysuria, frequency, hematuria and urgency.  Musculoskeletal:  Negative for back pain, myalgias and neck pain.  Skin:  Negative for rash.  Allergic/Immunologic: Negative for environmental allergies.  Neurological:  Negative for dizziness and headaches.  Hematological:  Negative for adenopathy. Does not bruise/bleed easily.  Psychiatric/Behavioral:  Negative for suicidal ideas. The patient is not  nervous/anxious.    Patient Active Problem List   Diagnosis Date Noted   Varicose veins of bilateral lower extremities with pain 12/06/2018   Infertility male 05/30/2013    No Known Allergies  Past Surgical History:  Procedure Laterality Date   VARICOSE VEIN SURGERY Bilateral     Social History   Tobacco Use   Smoking status: Never   Smokeless tobacco: Current    Types: Snuff  Substance Use Topics   Alcohol use: Yes    Alcohol/week: 0.0 standard drinks   Drug use: No     Medication list has been reviewed and updated.  Current Meds  Medication Sig   clomiPHENE (CLOMID) 50 MG tablet Take 25 mg by mouth daily. fertility   montelukast (SINGULAIR) 10 MG tablet Take 1 tablet (10 mg total) by mouth at bedtime.    PHQ 2/9 Scores 08/17/2020 02/02/2020 05/11/2019 11/15/2018  PHQ - 2 Score 0 0 0 0  PHQ- 9 Score 0 0 0 0    GAD 7 : Generalized Anxiety Score 08/17/2020 02/02/2020 05/11/2019  Nervous, Anxious, on Edge 0 0 0  Control/stop worrying 0 0 0  Worry too much - different things 0 0 0  Trouble relaxing 0 0 0  Restless 0 0 0  Easily annoyed or irritable 0 0 0  Afraid - awful might happen 0 0 0  Total GAD 7 Score 0 0 0    BP Readings from Last 3 Encounters:  01/23/21 120/70  08/17/20 120/80  05/11/20 110/80    Physical Exam Vitals and nursing note reviewed.  HENT:     Head: Normocephalic.     Right Ear: External ear normal.     Left Ear: External ear normal.     Nose: Nose normal.  Eyes:     General: No scleral icterus.       Right eye: No discharge.        Left eye: No discharge.     Conjunctiva/sclera: Conjunctivae normal.     Pupils: Pupils are equal, round, and reactive to light.  Neck:     Thyroid: No thyromegaly.     Vascular: No JVD.     Trachea: No tracheal deviation.  Cardiovascular:     Rate and Rhythm: Normal rate and regular rhythm.     Heart sounds: Normal heart sounds. No murmur heard.   No friction rub. No gallop.  Pulmonary:     Effort:  No respiratory distress.     Breath sounds: Normal breath sounds. No wheezing, rhonchi or rales.  Abdominal:     General: Bowel sounds are normal.     Palpations: Abdomen is soft. There is no hepatomegaly, splenomegaly or mass.     Tenderness: There is no abdominal tenderness. There is no guarding or rebound.     Hernia: There is no hernia in the umbilical area or ventral area.  Musculoskeletal:        General: No tenderness. Normal range of motion.     Cervical back: Normal range of motion and neck supple.  Lymphadenopathy:     Cervical: No cervical adenopathy.     Upper Body:     Right upper body: No epitrochlear adenopathy.     Left upper body: No epitrochlear adenopathy.     Comments: Palpable node right arm  Skin:    General: Skin is warm.     Findings: Lesion present. No rash.     Comments: Wart  flex surface right arm  Neurological:     Mental Status: He is alert and oriented to person, place, and time.     Cranial Nerves: No cranial nerve deficit.     Deep Tendon Reflexes: Reflexes are normal and symmetric.    Wt Readings from Last 3 Encounters:  01/23/21 241 lb (109.3 kg)  08/17/20 231 lb (104.8 kg)  05/11/20 235 lb (106.6 kg)    BP 120/70   Pulse 72   Ht 6\' 4"  (1.93 m)   Wt 241 lb (109.3 kg)   BMI 29.34 kg/m   Assessment and Plan:  1. Thrombocytopenia ([place]) New onset.  Patient's head recent lab work indicating decreased platelet count to a mild degree.  First noted about 9 months ago at 142,83-month ago repeat noted 130,000.  Therefore there is a risk repetitive trend and we will refer to hematology for evaluation particularly given the next concern. - Ambulatory referral to Hematology / Oncology  2. Adenopathy Patient is noted for about 6 months and enlarged not underneath a wart of the flexor surface of his right arm.  It is movable nontender and we will also bring this to the attention of hematology for possible further evaluation.  Patient has not had any  recent weight loss and there is no palpable enlarged spleen. - Ambulatory referral to Hematology / Oncology

## 2021-01-23 NOTE — Patient Instructions (Signed)
Thrombocytopenia Thrombocytopenia is a condition in which there are a low number of platelets in the blood. Platelets are also called thrombocytes. Platelets are parts of blood that stick together and form a clot to help the body stop bleeding after an injury. If you have too few platelets, your blood may have trouble clotting. This may cause you to bleed and bruise very easily. Some cases of thrombocytopenia are mild while others are more severe. What are the causes? This condition is caused by a low number of platelets in your blood. There are three main reasons for this: Your body not making enough platelets. This may be caused by: Bone marrow diseases. This include aplastic anemia, leukemia, and myelodysplastic anemia. Congenital thrombocytopenia. This is a condition that is passed from parent to child (inherited). Certain cancer treatments, including chemotherapy and radiation therapy. Infections from bacteria or viruses. Alcohol use disorder and alcoholism. Platelets not being released in the blood. This is called platelet sequestration and it can happen due to: An overactive spleen (hypersplenism). The spleen gathers up platelets from circulation, meaning that the platelets are not available to help with clotting your blood. The spleen can be enlarged because of scarring or other conditions. Gaucher disease. Your body destroying platelets too quickly. This may be caused by: An autoimmune disease that causes immune thrombocytopenia (ITP). ITP is sometimes associated with other autoimmune conditions such as lupus. Certain medicines, such as blood thinners. Certain blood clotting or bleeding disorders. Exposure to toxic chemicals, such as pesticides, lead, benzene, and arsenic. Pregnancy. What are the signs or symptoms? Symptoms of this condition are the result of poor blood clotting. They will vary depending on how low the platelet counts are. Symptoms may include: Bruising  easily. Bleeding from the mouth or nose. Heavy menstrual periods. Blood in the urine, stool (feces), or vomit. Purplish-red discolorations on the skin (purpura). A rash that looks like pinpoint, purplish-red spots (petechiae) on the lower legs. How is this diagnosed? This condition may be diagnosed with blood tests and a physical exam. You may also have other tests, including: A sample of bone marrow (biopsy) may be removed to look for the original cells that make platelets. An ultrasound or CT scan of the abdomen to check for an enlarged spleen, enlarged lymph nodes, or liver problems. How is this treated? Treatment for this condition depends on the cause. Treatment may include: Treatment of another condition that is causing the low platelet count. Medicines to help protect your platelets from being destroyed. A replacement (transfusion) of platelets to stop or prevent bleeding. Surgery to remove the spleen. Follow these instructions at home: Medicines Take over-the-counter and prescription medicines only as told by your health care provider. Do not take any medicines that contain aspirin or NSAIDs, such as ibuprofen. These medicines increase your risk for dangerous bleeding. Activity Avoid activities that could cause injury or bruising, and follow instructions about how to prevent falls. Do not play contact sports. Ask your health care provider what activities are safe for you. Take extra care to protect yourself from burns when ironing or cooking. Take extra care not to cut yourself when you shave or when you use scissors, needles, knives, and other tools. General instructions  Check your skin and the inside of your mouth for bruising or bleeding as told by your health care provider. Wear a medical alert bracelet that says that you have a bleeding disorder. This can help you get the treatment you need in case of emergency. Check your  urine and stool for blood as told by your health  care provider. Do not drink alcohol. If you do drink alcohol, limit the amount that you drink. Minimize contact with toxic chemicals. Tell all your health care providers, including dental care providers and eye doctors, about your condition. Make sure to tell dental care providers before you have any procedure done, including dental cleanings. Keep all follow-up visits. This is important. Contact a health care provider if: You have unexplained bruising. You have new symptoms. You have symptoms that get worse. You have a fever. Get help right away if: You have severe bleeding from anywhere on your body. You have blood in your vomit, urine, or stool. You have an injury to your head. You have a sudden, severe headache. Summary Thrombocytopenia is a condition in which you have a low number of platelets in the blood. Platelets are parts of blood that stick together to form a clot. Symptoms of this condition are the result of poor blood clotting and may include bruising easily, bleeding from the nose or mouth, petechiae, and purpura. This condition may be diagnosed with blood tests and a physical exam. Treatment for this condition depends on the cause. This information is not intended to replace advice given to you by your health care provider. Make sure you discuss any questions you have with your health care provider. Document Revised: 08/16/2020 Document Reviewed: 08/16/2020 Elsevier Patient Education  [employer].

## 2021-02-04 ENCOUNTER — Other Ambulatory Visit: Payer: Self-pay

## 2021-02-04 ENCOUNTER — Encounter: Payer: Self-pay | Admitting: Oncology

## 2021-02-04 ENCOUNTER — Inpatient Hospital Stay: Payer: 59 | Attending: Oncology | Admitting: Oncology

## 2021-02-04 ENCOUNTER — Inpatient Hospital Stay: Payer: 59

## 2021-02-04 VITALS — BP 136/82 | HR 90 | Temp 99.0°F | Resp 20 | Ht 76.5 in | Wt 249.0 lb

## 2021-02-04 DIAGNOSIS — D696 Thrombocytopenia, unspecified: Secondary | ICD-10-CM

## 2021-02-04 LAB — CBC WITH DIFFERENTIAL/PLATELET
Abs Immature Granulocytes: 0.02 10*3/uL (ref 0.00–0.07)
Basophils Absolute: 0 10*3/uL (ref 0.0–0.1)
Basophils Relative: 0 %
Eosinophils Absolute: 0.4 10*3/uL (ref 0.0–0.5)
Eosinophils Relative: 5 %
HCT: 45 % (ref 39.0–52.0)
Hemoglobin: 15.6 g/dL (ref 13.0–17.0)
Immature Granulocytes: 0 %
Lymphocytes Relative: 35 %
Lymphs Abs: 2.7 10*3/uL (ref 0.7–4.0)
MCH: 33 pg (ref 26.0–34.0)
MCHC: 34.7 g/dL (ref 30.0–36.0)
MCV: 95.1 fL (ref 80.0–100.0)
Monocytes Absolute: 0.7 10*3/uL (ref 0.1–1.0)
Monocytes Relative: 9 %
Neutro Abs: 4 10*3/uL (ref 1.7–7.7)
Neutrophils Relative %: 51 %
Platelets: 150 10*3/uL (ref 150–400)
RBC: 4.73 MIL/uL (ref 4.22–5.81)
RDW: 12 % (ref 11.5–15.5)
WBC: 7.8 10*3/uL (ref 4.0–10.5)
nRBC: 0 % (ref 0.0–0.2)

## 2021-02-04 LAB — HEPATITIS C ANTIBODY: HCV Ab: NONREACTIVE

## 2021-02-04 LAB — VITAMIN B12: Vitamin B-12: 602 pg/mL (ref 180–914)

## 2021-02-04 LAB — TECHNOLOGIST SMEAR REVIEW: Plt Morphology: ADEQUATE

## 2021-02-04 LAB — HIV ANTIBODY (ROUTINE TESTING W REFLEX): HIV Screen 4th Generation wRfx: NONREACTIVE

## 2021-02-04 LAB — FOLATE: Folate: 24 ng/mL (ref >5.9)

## 2021-02-04 NOTE — Progress Notes (Signed)
 Hematology/Oncology Consult note [facility] [phone] [phone]  Patient Care Team: Jones, Deanna C, MD as PCP - General (Family Medicine)   Name of the patient: Juan Gregory  [id number]  08/28/1980    Reason for referral-thrombocytopenia   Referring physician-Dr. Deanna Jones  Date of visit: 02/04/21   History of presenting illness-patient is a 40-year-old male with a past medical history significant for infertility for which she is on clomiphene.  He has been referred for thrombocytopenia.  He denies any bleeding or bruising.  Denies any over-the-counter medications or herbal supplements.  He is platelet count was normal up until 3 years ago but 8 months ago he was found to have a platelet count of 142.  It was subsequently repeated and was 130 and therefore the patient has been referred to us.  Denies any personal history of autoimmune disorders.  ECOG PS- 0  Pain scale- 0   Review of systems- Review of Systems  Constitutional:  Negative for chills, fever, malaise/fatigue and weight loss.  HENT:  Negative for congestion, ear discharge and nosebleeds.   Eyes:  Negative for blurred vision.  Respiratory:  Negative for cough, hemoptysis, sputum production, shortness of breath and wheezing.   Cardiovascular:  Negative for chest pain, palpitations, orthopnea and claudication.  Gastrointestinal:  Negative for abdominal pain, blood in stool, constipation, diarrhea, heartburn, melena, nausea and vomiting.  Genitourinary:  Negative for dysuria, flank pain, frequency, hematuria and urgency.  Musculoskeletal:  Negative for back pain, joint pain and myalgias.  Skin:  Negative for rash.  Neurological:  Negative for dizziness, tingling, focal weakness, seizures, weakness and headaches.  Endo/Heme/Allergies:  Does not bruise/bleed easily.  Psychiatric/Behavioral:  Negative for depression and suicidal ideas. The patient does not have insomnia.     No Known Allergies  Patient Active Problem List   Diagnosis Date Noted   Varicose veins of bilateral lower extremities with pain 12/06/2018   Infertility male 05/30/2013     History reviewed. No pertinent past medical history.   Past Surgical History:  Procedure Laterality Date   VARICOSE VEIN SURGERY Bilateral     Social History   Socioeconomic History   Marital status: Married    Spouse name: Not on file   Number of children: Not on file   Years of education: Not on file   Highest education level: Not on file  Occupational History   Not on file  Tobacco Use   Smoking status: Never   Smokeless tobacco: Current    Types: Snuff  Substance and Sexual Activity   Alcohol use: Yes    Alcohol/week: 0.0 standard drinks   Drug use: No   Sexual activity: Not on file  Other Topics Concern   Not on file  Social History Narrative   Not on file   Social Determinants of Health   Financial Resource Strain: Not on file  Food Insecurity: Not on file  Transportation Needs: Not on file  Physical Activity: Not on file  Stress: Not on file  Social Connections: Not on file  Intimate Partner Violence: Not on file     Family History  Problem Relation Age of Onset   Heart attack Father      Current Outpatient Medications:    clomiPHENE (CLOMID) 50 MG tablet, Take 25 mg by mouth daily. fertility, Disp: , Rfl:    montelukast (SINGULAIR) 10 MG tablet, Take 1 tablet (10 mg total) by mouth at bedtime., Disp: 30 tablet, Rfl: 3     Multiple Vitamin (MULTIVITAMIN) tablet, Take 1 tablet by mouth daily., Disp: , Rfl:    tadalafil (CIALIS) 5 MG tablet, Take 5 mg by mouth daily., Disp: , Rfl:    carbamide peroxide (DEBROX) 6.5 % OTIC solution, Place 5 drops into both ears 2 (two) times daily. (Patient not taking: Reported on 01/23/2021), Disp: 15 mL, Rfl: 0   Physical exam:  Vitals:   02/04/21 1333  BP: 136/82  Pulse: 90  Resp: 20  Temp: 99 F (37.2 C)  SpO2: 98%  Weight: 249  lb (112.9 kg)  Height: 6' 4.5" (1.943 m)   Physical Exam Constitutional:      General: He is not in acute distress. Cardiovascular:     Rate and Rhythm: Normal rate and regular rhythm.     Heart sounds: Normal heart sounds.  Pulmonary:     Effort: Pulmonary effort is normal.     Breath sounds: Normal breath sounds.  Abdominal:     General: Bowel sounds are normal.     Palpations: Abdomen is soft.     Comments: No palpable hepatosplenomegaly  Skin:    General: Skin is warm and dry.  Neurological:     Mental Status: He is alert and oriented to person, place, and time.       CMP Latest Ref Rng & Units 05/23/2020  Glucose 65 - 99 mg/dL 91  BUN 6 - 24 mg/dL 14  Creatinine 0.76 - 1.27 mg/dL 1.03  Sodium 134 - 144 mmol/L 141  Potassium 3.5 - 5.2 mmol/L 4.3  Chloride 96 - 106 mmol/L 104  CO2 20 - 29 mmol/L 22  Calcium 8.7 - 10.2 mg/dL 9.2  Total Protein 6.0 - 8.5 g/dL 7.3  Total Bilirubin 0.0 - 1.2 mg/dL 0.4  Alkaline Phos 44 - 121 IU/L 71  AST 0 - 40 IU/L 29  ALT 0 - 44 IU/L 30   CBC Latest Ref Rng & Units 02/04/2021  WBC 4.0 - 10.5 K/uL 7.8  Hemoglobin 13.0 - 17.0 g/dL 15.6  Hematocrit 39.0 - 52.0 % 45.0  Platelets 150 - 400 K/uL 150    Assessment and plan- Patient is a 40 y.o. male referred for thrombocytopenia  Thrombocytopenia is mild and isolated and could be possibly autoimmune secondary to ITP.  We will check CBC with differential, B12 folate HIV and hepatitis C and smear review today.  Video visit with me next week.  If work-up above is unremarkable patient can continue to follow-up with Dr. Jones.  Discussed what ITP is and the indication for treatment of the platelet counts are less than 30.  Given he has isolated thrombocytopenia in the absence of other cytopenias this is unlikely to be a primary bone marrow process and does not require a bone marrow biopsy at this time   Thank you for this kind referral and the opportunity to participate in the care of this  patient   Visit Diagnosis 1. Thrombocytopenia ([facility])     Dr. Archana Rao, MD, MPH [facility] [phone] 02/04/2021                

## 2021-02-14 ENCOUNTER — Inpatient Hospital Stay: Payer: 59 | Admitting: Oncology

## 2021-02-25 ENCOUNTER — Encounter: Payer: Self-pay | Admitting: Family Medicine

## 2021-03-07 ENCOUNTER — Ambulatory Visit: Payer: 59 | Admitting: Family Medicine

## 2021-03-12 ENCOUNTER — Ambulatory Visit: Payer: 59

## 2021-03-15 ENCOUNTER — Inpatient Hospital Stay: Payer: 59 | Attending: Oncology | Admitting: Nurse Practitioner

## 2021-03-15 DIAGNOSIS — D696 Thrombocytopenia, unspecified: Secondary | ICD-10-CM | POA: Diagnosis not present

## 2021-03-15 NOTE — Progress Notes (Signed)
Hematology/Oncology Consult note [facility] [phone] [phone]   Virtual Visit Progress Note  I connected with Juan Gregory on 03/15/21 at  3:15 PM EST by video enabled telemedicine visit and verified that I am speaking with the correct person using two identifiers.   I discussed the limitations, risks, security and privacy concerns of performing an evaluation and management service by telemedicine and the availability of in-person appointments. I also discussed with the patient that there may be a patient responsible charge related to this service. The patient expressed understanding and agreed to proceed.   Other persons participating in the visit and their role in the encounter: none   Patients location: home  Providers location: clinic   Chief Complaint: thrombocytopenia  Patient Care Team: Juline Patch, MD as PCP - General (Family Medicine)   Name of the patient: Juan Gregory  [id number]  1980/12/03   Reason for referral-thrombocytopenia   Referring physician-Dr. Otilio Miu  Date of visit: 03/15/21  History of presenting illness-patient is a 40 year old male with a past medical history significant for infertility for which she is on clomiphene.  He has been referred for thrombocytopenia.  He denies any bleeding or bruising.  Denies any over-the-counter medications or herbal supplements.  He is platelet count was normal up until 3 years ago but 8 months ago he was found to have a platelet count of 142.  It was subsequently repeated and was 130 and therefore the patient has been referred to [place].  Denies any personal history of autoimmune disorders.  Interval History: Patient is 40 year old male with above history of thrombocytopenia thought to be secondary to ITP who returns to clinic for discussion of lab results and follow-up.  He continues to feel well and denies specific complaints.  No abnormal bleeding or  bruising  ECOG PS- 0  Pain scale- 0   Review of systems- Review of Systems  Constitutional:  Negative for chills, fever, malaise/fatigue and weight loss.  HENT:  Negative for congestion, ear discharge and nosebleeds.   Eyes:  Negative for blurred vision.  Respiratory:  Negative for cough, hemoptysis, sputum production, shortness of breath and wheezing.   Cardiovascular:  Negative for chest pain, palpitations, orthopnea and claudication.  Gastrointestinal:  Negative for abdominal pain, blood in stool, constipation, diarrhea, heartburn, melena, nausea and vomiting.  Genitourinary:  Negative for dysuria, flank pain, frequency, hematuria and urgency.  Musculoskeletal:  Negative for back pain, joint pain and myalgias.  Skin:  Negative for rash.  Neurological:  Negative for dizziness, tingling, focal weakness, seizures, weakness and headaches.  Endo/Heme/Allergies:  Does not bruise/bleed easily.  Psychiatric/Behavioral:  Negative for depression and suicidal ideas. The patient does not have insomnia.    No Known Allergies  Patient Active Problem List   Diagnosis Date Noted   Varicose veins of bilateral lower extremities with pain 12/06/2018   Infertility male 05/30/2013     No past medical history on file.   Past Surgical History:  Procedure Laterality Date   VARICOSE VEIN SURGERY Bilateral     Social History   Socioeconomic History   Marital status: Married    Spouse name: Not on file   Number of children: Not on file   Years of education: Not on file   Highest education level: Not on file  Occupational History   Not on file  Tobacco Use   Smoking status: Never   Smokeless tobacco: Current    Types: Snuff  Substance and  Activity  ° Alcohol use: Yes  °  Alcohol/week: 0.0 standard drinks  ° Drug use: No  ° Sexual activity: Not on file  °Other Topics Concern  ° Not on file  °Social History Narrative  ° Not on file  ° °Social Determinants of Health  ° °Financial  Resource Strain: Not on file  °Food Insecurity: Not on file  °Transportation Needs: Not on file  °Physical Activity: Not on file  °Stress: Not on file  °Social Connections: Not on file  °Intimate Partner Violence: Not on file  ° °  °Family History  °Problem Relation Age of Onset  ° Heart attack Father   ° ° ° °Current Outpatient Medications:  °  clomiPHENE (CLOMID) 50 MG tablet, Take 25 mg by mouth daily. fertility, Disp: , Rfl:  °  montelukast (SINGULAIR) 10 MG tablet, Take 1 tablet (10 mg total) by mouth at bedtime., Disp: 30 tablet, Rfl: 3 °  Multiple Vitamin (MULTIVITAMIN) tablet, Take 1 tablet by mouth daily., Disp: , Rfl:  °  tadalafil (CIALIS) 5 MG tablet, Take 5 mg by mouth daily., Disp: , Rfl:  °  carbamide peroxide (DEBROX) 6.5 % OTIC solution, Place 5 drops into both ears 2 (two) times daily. (Patient not taking: Reported on 01/23/2021), Disp: 15 mL, Rfl: 0 ° ° °Physical exam:  °There were no vitals filed for this visit. ° °Physical Exam °Vitals reviewed.  °Constitutional:   °   Appearance: He is not ill-appearing.  °Pulmonary:  °   Effort: No respiratory distress.  °Skin: °   Coloration: Skin is not pale.  °Neurological:  °   Mental Status: He is alert and oriented to person, place, and time.  °Psychiatric:     °   Mood and Affect: Mood normal.     °   Behavior: Behavior normal.  °  °CBC Latest Ref Rng & Units 02/04/2021 12/04/2020 05/23/2020  °WBC 4.0 - 10.5 K/uL 7.8 7.0 5.1  °Hemoglobin 13.0 - 17.0 g/dL 15.6 14.7 14.8  °Hematocrit 39.0 - 52.0 % 45.0 43 42.2  °Platelets 150 - 400 K/uL 150 130(A) 142(L)  ° °CMP Latest Ref Rng & Units 05/23/2020 05/11/2019 11/24/2017  °Glucose 65 - 99 mg/dL 91 78 156(H)  °BUN 6 - 24 mg/dL 14 13 14  °Creatinine 0.76 - 1.27 mg/dL 1.03 1.03 1.05  °Sodium 134 - 144 mmol/L 141 142 139  °Potassium 3.5 - 5.2 mmol/L 4.3 4.0 3.7  °Chloride 96 - 106 mmol/L 104 102 106  °CO2 20 - 29 mmol/L 22 22 25  °Calcium 8.7 - 10.2 mg/dL 9.2 9.8 8.9  °Total Protein 6.0 - 8.5 g/dL 7.3 - 7.1  °Total  Bilirubin 0.0 - 1.2 mg/dL 0.4 - 0.6  °Alkaline Phos 44 - 121 IU/L 71 - 51  °AST 0 - 40 IU/L 29 - 27  °ALT 0 - 44 IU/L 30 - 26  ° ° °Assessment and plan- Patient is a 40 y.o. male who presents via telemedicine for follow-up for thrombocytopenia.  Thrombocytopenia is mild isolated.  Could possibly be autoimmune secondary to ITP.  Work-up included B12, folate, HIV, hep C, smear review which were negative or unrevealing.  Recheck of CBC reveals platelet count of 150,000 which is low end of normal.  We again discussed that indication for treatment would include platelet counts of less than 30,000.  Given that he has isolated thrombocytopenia in the absence of other cytopenias he does not require bone marrow biopsy at this time.  We will   plan to check his platelet count when he is back in town in the next couple of weeks.  If normal he can be released to follow-up to his PCP, Dr. Jones. ° °Visit Diagnosis °1. Thrombocytopenia (HCC)   °  °I discussed the assessment and treatment plan with the patient. The patient was provided an opportunity to ask questions and all were answered. The patient agreed with the plan and demonstrated an understanding of the instructions. °  °The patient was advised to call back or seek an in-person evaluation if the symptoms worsen or if the condition fails to improve as anticipated. °  °I spent 15 minutes face-to-face video visit time dedicated to the care of this patient on the date of this encounter to include pre-visit review of hematology notes and labs,, face-to-face time with the patient, and post visit ordering of testing/documentation.  °  °Lauren Allen, DNP, AGNP-C °[facility] ° °

## 2021-03-19 ENCOUNTER — Ambulatory Visit
Admission: RE | Admit: 2021-03-19 | Discharge: 2021-03-19 | Disposition: A | Discharge status: Home / Self Care | Payer: 59 | Source: Ambulatory Visit | Attending: Family Medicine | Admitting: Family Medicine

## 2021-03-19 ENCOUNTER — Encounter: Payer: Self-pay | Admitting: Family Medicine

## 2021-03-19 ENCOUNTER — Inpatient Hospital Stay: Payer: 59 | Attending: Nurse Practitioner

## 2021-03-19 ENCOUNTER — Other Ambulatory Visit: Payer: Self-pay

## 2021-03-19 ENCOUNTER — Ambulatory Visit
Admission: RE | Admit: 2021-03-19 | Discharge: 2021-03-19 | Disposition: A | Discharge status: Home / Self Care | Payer: 59 | Attending: Family Medicine | Admitting: Family Medicine

## 2021-03-19 ENCOUNTER — Ambulatory Visit (INDEPENDENT_AMBULATORY_CARE_PROVIDER_SITE_OTHER): Payer: 59 | Admitting: Family Medicine

## 2021-03-19 VITALS — BP 100/62 | HR 100 | Ht 76.5 in | Wt 244.0 lb

## 2021-03-19 DIAGNOSIS — J309 Allergic rhinitis, unspecified: Secondary | ICD-10-CM

## 2021-03-19 DIAGNOSIS — R053 Chronic cough: Secondary | ICD-10-CM | POA: Diagnosis present

## 2021-03-19 DIAGNOSIS — J219 Acute bronchiolitis, unspecified: Secondary | ICD-10-CM

## 2021-03-19 DIAGNOSIS — F17200 Nicotine dependence, unspecified, uncomplicated: Secondary | ICD-10-CM

## 2021-03-19 DIAGNOSIS — D696 Thrombocytopenia, unspecified: Secondary | ICD-10-CM | POA: Insufficient documentation

## 2021-03-19 LAB — CBC WITH DIFFERENTIAL/PLATELET
Abs Immature Granulocytes: 0.05 10*3/uL (ref 0.00–0.07)
Basophils Absolute: 0 10*3/uL (ref 0.0–0.1)
Basophils Relative: 0 %
Eosinophils Absolute: 0.1 10*3/uL (ref 0.0–0.5)
Eosinophils Relative: 2 %
HCT: 47.2 % (ref 39.0–52.0)
Hemoglobin: 16 g/dL (ref 13.0–17.0)
Immature Granulocytes: 1 %
Lymphocytes Relative: 26 %
Lymphs Abs: 1.5 10*3/uL (ref 0.7–4.0)
MCH: 33.1 pg (ref 26.0–34.0)
MCHC: 33.9 g/dL (ref 30.0–36.0)
MCV: 97.7 fL (ref 80.0–100.0)
Monocytes Absolute: 1.3 10*3/uL — ABNORMAL HIGH (ref 0.1–1.0)
Monocytes Relative: 22 %
Neutro Abs: 2.8 10*3/uL (ref 1.7–7.7)
Neutrophils Relative %: 49 %
Platelets: 120 10*3/uL — ABNORMAL LOW (ref 150–400)
RBC: 4.83 MIL/uL (ref 4.22–5.81)
RDW: 12.3 % (ref 11.5–15.5)
WBC: 5.8 10*3/uL (ref 4.0–10.5)
nRBC: 0 % (ref 0.0–0.2)

## 2021-03-19 MED ORDER — BENZONATATE 100 MG PO CAPS
100.0000 mg | ORAL_CAPSULE | Freq: Three times a day (TID) | ORAL | 1 refills | Status: DC | PRN
Start: 2021-03-19 — End: 2021-05-28

## 2021-03-19 MED ORDER — PROMETHAZINE-DM 6.25-15 MG/5ML PO SYRP: 5.0000 mL | ORAL_SOLUTION | Freq: Four times a day (QID) | ORAL | 0 refills | Status: DC | PRN

## 2021-03-19 MED ORDER — MONTELUKAST SODIUM 10 MG PO TABS: 10.0000 mg | ORAL_TABLET | Freq: Every day | ORAL | 3 refills | Status: DC

## 2021-03-19 MED ORDER — TRIAMCINOLONE ACETONIDE 55 MCG/ACT NA AERO: 2.0000 | INHALATION_SPRAY | Freq: Every day | NASAL | 12 refills | Status: DC

## 2021-03-19 NOTE — Progress Notes (Signed)
Date:  03/19/2021   Name:  Juan Gregory   DOB:  17-Aug-1980   MRN:  [id number]   Chief Complaint: Cough (More in  the am- is on day 8 of ZPack)  Cough This is a new problem. The current episode started 1 to 4 weeks ago (nonproductive prior/prod last week). The problem has been waxing and waning. The problem occurs every few minutes. The cough is Non-productive. Associated symptoms include a fever and postnasal drip. Pertinent negatives include no chest pain, chills, ear congestion, ear pain, headaches, heartburn, hemoptysis, myalgias, nasal congestion, rash, rhinorrhea, sore throat, shortness of breath or wheezing. The symptoms are aggravated by cold air. Risk factors for lung disease include smoking/tobacco exposure. He has tried oral steroids for the symptoms. The treatment provided mild relief. There is no history of environmental allergies.   Lab Results  Component Value Date   NA 141 05/23/2020   K 4.3 05/23/2020   CO2 22 05/23/2020   GLUCOSE 91 05/23/2020   BUN 14 05/23/2020   CREATININE 1.03 05/23/2020   CALCIUM 9.2 05/23/2020   EGFR 94 05/23/2020   GFRNONAA 90 05/11/2019   Lab Results  Component Value Date   CHOL 168 05/23/2020   HDL 42 05/23/2020   LDLCALC 116 (H) 05/23/2020   TRIG 50 05/23/2020   No results found for: TSH No results found for: HGBA1C Lab Results  Component Value Date   WBC 7.8 02/04/2021   HGB 15.6 02/04/2021   HCT 45.0 02/04/2021   MCV 95.1 02/04/2021   PLT 150 02/04/2021   Lab Results  Component Value Date   ALT 30 05/23/2020   AST 29 05/23/2020   ALKPHOS 71 05/23/2020   BILITOT 0.4 05/23/2020   No results found for: 25OHVITD2, 25OHVITD3, VD25OH   Review of Systems  Constitutional:  Positive for fever. Negative for chills.  HENT:  Positive for postnasal drip. Negative for drooling, ear discharge, ear pain, rhinorrhea and sore throat.   Respiratory:  Positive for cough. Negative for hemoptysis, shortness of breath and wheezing.    Cardiovascular:  Negative for chest pain, palpitations and leg swelling.  Gastrointestinal:  Negative for abdominal pain, blood in stool, constipation, diarrhea, heartburn and nausea.  Endocrine: Negative for polydipsia.  Genitourinary:  Negative for dysuria, frequency, hematuria and urgency.  Musculoskeletal:  Negative for back pain, myalgias and neck pain.  Skin:  Negative for rash.  Allergic/Immunologic: Negative for environmental allergies.  Neurological:  Negative for dizziness and headaches.  Hematological:  Does not bruise/bleed easily.  Psychiatric/Behavioral:  Negative for suicidal ideas. The patient is not nervous/anxious.    Patient Active Problem List   Diagnosis Date Noted   Varicose veins of bilateral lower extremities with pain 12/06/2018   Infertility male 05/30/2013    No Known Allergies  Past Surgical History:  Procedure Laterality Date   VARICOSE VEIN SURGERY Bilateral     Social History   Tobacco Use   Smoking status: Never   Smokeless tobacco: Current    Types: Snuff  Substance Use Topics   Alcohol use: Yes    Alcohol/week: 0.0 standard drinks   Drug use: No     Medication list has been reviewed and updated.  Current Meds  Medication Sig   azithromycin (ZITHROMAX) 250 MG tablet TAKE 2 TABLETS BY MOUTH TODAY, THEN TAKE 1 TABLET DAILY FOR 4 DAYS   montelukast (SINGULAIR) 10 MG tablet Take 1 tablet (10 mg total) by mouth at bedtime.   Multiple Vitamin (MULTIVITAMIN)  tablet Take 1 tablet by mouth daily.   tadalafil (CIALIS) 5 MG tablet Take 5 mg by mouth daily.    PHQ 2/9 Scores 08/17/2020 02/02/2020 05/11/2019 11/15/2018  PHQ - 2 Score 0 0 0 0  PHQ- 9 Score 0 0 0 0    GAD 7 : Generalized Anxiety Score 08/17/2020 02/02/2020 05/11/2019  Nervous, Anxious, on Edge 0 0 0  Control/stop worrying 0 0 0  Worry too much - different things 0 0 0  Trouble relaxing 0 0 0  Restless 0 0 0  Easily annoyed or irritable 0 0 0  Afraid - awful might happen 0 0 0   Total GAD 7 Score 0 0 0    BP Readings from Last 3 Encounters:  03/19/21 100/62  02/04/21 136/82  01/23/21 120/70    Physical Exam Vitals and nursing note reviewed.  HENT:     Head: Normocephalic.     Right Ear: External ear normal.     Left Ear: External ear normal.     Nose: Congestion present. No rhinorrhea.     Right Turbinates: Swollen.     Left Turbinates: Swollen.  Eyes:     General: No scleral icterus.       Right eye: No discharge.        Left eye: No discharge.     Conjunctiva/sclera: Conjunctivae normal.     Pupils: Pupils are equal, round, and reactive to light.  Neck:     Thyroid: No thyromegaly.     Vascular: No JVD.     Trachea: No tracheal deviation.  Cardiovascular:     Rate and Rhythm: Normal rate and regular rhythm.     Heart sounds: Normal heart sounds, S1 normal and S2 normal. No murmur heard. No systolic murmur is present.  No diastolic murmur is present.    No friction rub. No gallop. No S3 or S4 sounds.  Pulmonary:     Effort: No respiratory distress.     Breath sounds: Normal breath sounds. No wheezing, rhonchi or rales.  Abdominal:     General: Bowel sounds are normal.     Palpations: Abdomen is soft. There is no mass.     Tenderness: There is no abdominal tenderness. There is no guarding or rebound.  Musculoskeletal:        General: No tenderness. Normal range of motion.     Cervical back: Normal range of motion and neck supple.  Lymphadenopathy:     Cervical: No cervical adenopathy.  Skin:    General: Skin is warm.     Findings: No rash.  Neurological:     Mental Status: He is alert and oriented to person, place, and time.     Cranial Nerves: No cranial nerve deficit.     Deep Tendon Reflexes: Reflexes are normal and symmetric.    Wt Readings from Last 3 Encounters:  03/19/21 244 lb (110.7 kg)  02/04/21 249 lb (112.9 kg)  01/23/21 241 lb (109.3 kg)    BP 100/62    Pulse 100    Ht 6' 4.5" (1.943 m)    Wt 244 lb (110.7 kg)     SpO2 98%    BMI 29.31 kg/m   Assessment and Plan:  1. Bronchiolitis New onset.  Persistent.  This began about 7 days ago for which she had a telemedicine visit and was placed on a Z-Pak.  Patient is doing better but still has a nonproductive cough.  We will start on Tessalon Perles  3 times a day as needed as well as promethazine with dextromethorphan to be used primarily at night.  We will check a chest x-ray on patient - benzonatate (TESSALON PERLES) 100 MG capsule; Take 1 capsule (100 mg total) by mouth 3 (three) times daily as needed for cough.  Dispense: 30 capsule; Refill: 1 - promethazine-dextromethorphan (PROMETHAZINE-DM) 6.25-15 MG/5ML syrup; Take 5 mLs by mouth 4 (four) times daily as needed for cough.  Dispense: 118 mL; Refill: 0 - DG Chest 2 View; Future  2. Allergic rhinitis, unspecified seasonality, unspecified trigger Chronic.  Uncontrolled.  Improving.  But continues to be symptomatic.  Patient's been off with his Singulair and will resume this as well as go with Nasacort nasal spray. - montelukast (SINGULAIR) 10 MG tablet; Take 1 tablet (10 mg total) by mouth at bedtime.  Dispense: 30 tablet; Refill: 3 - triamcinolone (NASACORT) 55 MCG/ACT AERO nasal inhaler; Place 2 sprays into the nose daily.  Dispense: 1 each; Refill: 12  3. Tobacco dependence with current use Patient has been advised of the health risks of smoking and counseled concerning cessation of tobacco products. I spent over 3 minutes for discussion and to answer questions.   4. Cough, persistent New onset.  Persistent.  Nonproductive cough.  As noted above and we will proceed with evaluation chest x-ray.  Have discussed with patient given the bronchiolitis respiratory concerned that cough is sometimes the last to go. - triamcinolone (NASACORT) 55 MCG/ACT AERO nasal inhaler; Place 2 sprays into the nose daily.  Dispense: 1 each; Refill: 12 - benzonatate (TESSALON PERLES) 100 MG capsule; Take 1 capsule (100 mg total)  by mouth 3 (three) times daily as needed for cough.  Dispense: 30 capsule; Refill: 1 - promethazine-dextromethorphan (PROMETHAZINE-DM) 6.25-15 MG/5ML syrup; Take 5 mLs by mouth 4 (four) times daily as needed for cough.  Dispense: 118 mL; Refill: 0 - DG Chest 2 View; Future

## 2021-03-19 NOTE — Patient Instructions (Signed)
Managing the Challenge of Quitting Smoking ?Quitting smoking is a physical and mental challenge. You will face cravings, withdrawal symptoms, and temptation. Before quitting, work with your health care provider to make a plan that can help you manage quitting. Preparation can help you quit and keep you from giving in. ?How to manage lifestyle changes ?Managing stress ?Stress can make you want to smoke, and wanting to smoke may cause stress. It is important to find ways to manage your stress. You might try some of the following: ?Practice relaxation techniques. ?Breathe slowly and deeply, in through your nose and out through your mouth. ?Listen to music. ?Soak in a bath or take a shower. ?Imagine a peaceful place or vacation. ?Get some support. ?Talk with family or friends about your stress. ?Join a support group. ?Talk with a counselor or therapist. ?Get some physical activity. ?Go for a walk, run, or bike ride. ?Play a favorite sport. ?Practice yoga. ? ?Medicines ?Talk with your health care provider about medicines that might help you deal with cravings and make quitting easier for you. ?Relationships ?Social situations can be difficult when you are quitting smoking. To manage this, you can: ?Avoid parties and other social situations where people might be smoking. ?Avoid alcohol. ?Leave right away if you have the urge to smoke. ?Explain to your family and friends that you are quitting smoking. Ask for support and let them know you might be a bit grumpy. ?Plan activities where smoking is not an option. ?General instructions ?Be aware that many people gain weight after they quit smoking. However, not everyone does. To keep from gaining weight, have a plan in place before you quit and stick to the plan after you quit. Your plan should include: ?Having healthy snacks. When you have a craving, it may help to: ?Eat popcorn, carrots, celery, or other cut vegetables. ?Chew sugar-free gum. ?Changing how you eat. ?Eat small  portion sizes at meals. ?Eat 4-6 small meals throughout the day instead of 1-2 large meals a day. ?Be mindful when you eat. Do not watch television or do other things that might distract you as you eat. ?Exercising regularly. ?Make time to exercise each day. If you do not have time for a long workout, do short bouts of exercise for 5-10 minutes several times a day. ?Do some form of strengthening exercise, such as weight lifting. ?Do some exercise that gets your heart beating and causes you to breathe deeply, such as walking fast, running, swimming, or biking. This is very important. ?Drinking plenty of water or other low-calorie or no-calorie drinks. Drink 6-8 glasses of water daily. ? ?How to recognize withdrawal symptoms ?Your body and mind may experience discomfort as you try to get used to not having nicotine in your system. These effects are called withdrawal symptoms. They may include: ?Feeling hungrier than normal. ?Having trouble concentrating. ?Feeling irritable or restless. ?Having trouble sleeping. ?Feeling depressed. ?Craving a cigarette. ?To manage withdrawal symptoms: ?Avoid places, people, and activities that trigger your cravings. ?Remember why you want to quit. ?Get plenty of sleep. ?Avoid coffee and other caffeinated drinks. These may worsen some of your symptoms. ?These symptoms may surprise you. But be assured that they are normal to have when quitting smoking. ?How to manage cravings ?Come up with a plan for how to deal with your cravings. The plan should include the following: ?A definition of the specific situation you want to deal with. ?An alternative action you will take. ?A clear idea for how this action   will help. ?The name of someone who might help you with this. ?Cravings usually last for 5-10 minutes. Consider taking the following actions to help you with your plan to deal with cravings: ?Keep your mouth busy. ?Chew sugar-free gum. ?Suck on hard candies or a straw. ?Brush your  teeth. ?Keep your hands and body busy. ?Change to a different activity right away. ?Squeeze or play with a ball. ?Do an activity or a hobby, such as making bead jewelry, practicing needlepoint, or working with wood. ?Mix up your normal routine. ?Take a short exercise break. Go for a quick walk or run up and down stairs. ?Focus on doing something kind or helpful for someone else. ?Call a friend or family member to talk during a craving. ?Join a support group. ?Contact a quitline. ?Where to find support ?To get help or find a support group: ?Call the National Cancer Institute's Smoking Quitline: 1-800-QUIT NOW (784-8669) ?Visit the website of the Substance Abuse and Mental Health Services Administration: www.samhsa.gov ?Text QUIT to SmokefreeTXT: 478848 ?Where to find more information ?Visit these websites to find more information on quitting smoking: ?National Cancer Institute: www.smokefree.gov ?American Lung Association: www.lung.org ?American Cancer Society: www.cancer.org ?Centers for Disease Control and Prevention: www.cdc.gov ?American Heart Association: www.heart.org ?Contact a health care provider if: ?You want to change your plan for quitting. ?The medicines you are taking are not helping. ?Your eating feels out of control or you cannot sleep. ?Get help right away if: ?You feel depressed or become very anxious. ?Summary ?Quitting smoking is a physical and mental challenge. You will face cravings, withdrawal symptoms, and temptation to smoke again. Preparation can help you as you go through these challenges. ?Try different techniques to manage stress, handle social situations, and prevent weight gain. ?You can deal with cravings by keeping your mouth busy (such as by chewing gum), keeping your hands and body busy, calling family or friends, or contacting a quitline for people who want to quit smoking. ?You can deal with withdrawal symptoms by avoiding places where people smoke, getting plenty of rest, and  avoiding drinks with caffeine. ?This information is not intended to replace advice given to you by your health care provider. Make sure you discuss any questions you have with your health care provider. ?Document Revised: 11/09/2020 Document Reviewed: 12/21/2018 ?Elsevier Patient Education ? 2022 [employer] ? ?

## 2021-05-08 DIAGNOSIS — M5412 Radiculopathy, cervical region: Secondary | ICD-10-CM | POA: Insufficient documentation

## 2021-05-08 DIAGNOSIS — M7541 Impingement syndrome of right shoulder: Secondary | ICD-10-CM | POA: Insufficient documentation

## 2021-05-14 ENCOUNTER — Encounter: Payer: 59 | Admitting: Family Medicine

## 2021-05-28 ENCOUNTER — Other Ambulatory Visit: Payer: Self-pay

## 2021-05-28 ENCOUNTER — Ambulatory Visit (INDEPENDENT_AMBULATORY_CARE_PROVIDER_SITE_OTHER): Payer: 59 | Admitting: Family Medicine

## 2021-05-28 VITALS — BP 120/70 | HR 80 | Ht 76.5 in | Wt 235.0 lb

## 2021-05-28 DIAGNOSIS — E7801 Familial hypercholesterolemia: Secondary | ICD-10-CM | POA: Diagnosis not present

## 2021-05-28 DIAGNOSIS — N469 Male infertility, unspecified: Secondary | ICD-10-CM

## 2021-05-28 DIAGNOSIS — D696 Thrombocytopenia, unspecified: Secondary | ICD-10-CM | POA: Diagnosis not present

## 2021-05-28 DIAGNOSIS — Z0000 Encounter for general adult medical examination without abnormal findings: Secondary | ICD-10-CM

## 2021-05-28 DIAGNOSIS — Z1211 Encounter for screening for malignant neoplasm of colon: Secondary | ICD-10-CM | POA: Diagnosis not present

## 2021-05-28 LAB — HEMOCCULT GUIAC POC 1CARD (OFFICE): Fecal Occult Blood, POC: NEGATIVE

## 2021-05-28 NOTE — Progress Notes (Signed)
Date:  05/28/2021   Name:  Juan Gregory   DOB:  Feb 27, 1981   MRN:  [id number]   Chief Complaint: Annual Exam  Patient is a 41 year old male who presents for a comprehensive physical exam. The patient reports the following problems: testosterone concern. Health maintenance has been reviewed up to date.Juan Gregory is a 41 y.o. male who presents today for his Complete Annual Exam. He feels well. He reports exercising at gym. He reports he is sleeping fairly well.       Lab Results  Component Value Date   NA 141 05/23/2020   K 4.3 05/23/2020   CO2 22 05/23/2020   GLUCOSE 91 05/23/2020   BUN 14 05/23/2020   CREATININE 1.03 05/23/2020   CALCIUM 9.2 05/23/2020   EGFR 94 05/23/2020   GFRNONAA 90 05/11/2019   Lab Results  Component Value Date   CHOL 168 05/23/2020   HDL 42 05/23/2020   LDLCALC 116 (H) 05/23/2020   TRIG 50 05/23/2020   No results found for: TSH No results found for: HGBA1C Lab Results  Component Value Date   WBC 5.8 03/19/2021   HGB 16.0 03/19/2021   HCT 47.2 03/19/2021   MCV 97.7 03/19/2021   PLT 120 (L) 03/19/2021   Lab Results  Component Value Date   ALT 30 05/23/2020   AST 29 05/23/2020   ALKPHOS 71 05/23/2020   BILITOT 0.4 05/23/2020   No results found for: 25OHVITD2, 25OHVITD3, VD25OH   Review of Systems  Constitutional:  Negative for chills, fatigue, fever and unexpected weight change.  HENT:  Negative for drooling, ear discharge, ear pain and sore throat.   Respiratory:  Negative for cough, shortness of breath and wheezing.   Cardiovascular:  Negative for chest pain, palpitations and leg swelling.  Gastrointestinal:  Negative for abdominal pain, blood in stool, constipation, diarrhea and nausea.  Endocrine: Negative for polydipsia.  Genitourinary:  Negative for dysuria, frequency, hematuria and urgency.  Musculoskeletal:  Negative for back pain, myalgias and neck pain.  Skin:  Negative for rash.  Allergic/Immunologic: Negative for  environmental allergies.  Neurological:  Negative for dizziness and headaches.  Hematological:  Does not bruise/bleed easily.  Psychiatric/Behavioral:  Negative for suicidal ideas. The patient is not nervous/anxious.    Patient Active Problem List   Diagnosis Date Noted   Varicose veins of bilateral lower extremities with pain 12/06/2018   Infertility male 05/30/2013    No Known Allergies  Past Surgical History:  Procedure Laterality Date   VARICOSE VEIN SURGERY Bilateral     Social History   Tobacco Use   Smoking status: Never   Smokeless tobacco: Current    Types: Snuff  Substance Use Topics   Alcohol use: Yes    Alcohol/week: 0.0 standard drinks   Drug use: No     Medication list has been reviewed and updated.  Current Meds  Medication Sig   carbamide peroxide (DEBROX) 6.5 % OTIC solution Place 5 drops into both ears 2 (two) times daily.   montelukast (SINGULAIR) 10 MG tablet Take 1 tablet (10 mg total) by mouth at bedtime.   Multiple Vitamin (MULTIVITAMIN) tablet Take 1 tablet by mouth daily.   triamcinolone (NASACORT) 55 MCG/ACT AERO nasal inhaler Place 2 sprays into the nose daily.   [DISCONTINUED] tadalafil (CIALIS) 5 MG tablet Take 5 mg by mouth daily.    PHQ 2/9 Scores 05/28/2021 08/17/2020 02/02/2020 05/11/2019  PHQ - 2 Score 0 0 0 0  PHQ-  9 Score 0 0 0 0    GAD 7 : Generalized Anxiety Score 05/28/2021 08/17/2020 02/02/2020 05/11/2019  Nervous, Anxious, on Edge 0 0 0 0  Control/stop worrying 0 0 0 0  Worry too much - different things 0 0 0 0  Trouble relaxing 0 0 0 0  Restless 0 0 0 0  Easily annoyed or irritable 0 0 0 0  Afraid - awful might happen 0 0 0 0  Total GAD 7 Score 0 0 0 0  Anxiety Difficulty Not difficult at all - - -    BP Readings from Last 3 Encounters:  05/28/21 120/70  03/19/21 100/62  02/04/21 136/82    Physical Exam Vitals and nursing note reviewed.  Constitutional:      Appearance: He is well-developed and  well-groomed.  HENT:     Head: Normocephalic.     Jaw: There is normal jaw occlusion.     Right Ear: Hearing, tympanic membrane, ear canal and external ear normal.     Left Ear: Hearing, tympanic membrane, ear canal and external ear normal.     Nose: Nose normal. No congestion or rhinorrhea.     Mouth/Throat:     Mouth: Mucous membranes are moist.     Dentition: Normal dentition.     Tongue: No lesions.     Palate: No mass.     Pharynx: Oropharynx is clear. Uvula midline.     Tonsils: No tonsillar exudate.  Eyes:     General: Lids are normal. Vision grossly intact. Gaze aligned appropriately. No scleral icterus.       Right eye: No discharge.        Left eye: No discharge.     Extraocular Movements: Extraocular movements intact.     Conjunctiva/sclera: Conjunctivae normal.     Pupils: Pupils are equal, round, and reactive to light.  Neck:     Thyroid: No thyroid mass, thyromegaly or thyroid tenderness.     Vascular: Normal carotid pulses. No carotid bruit, hepatojugular reflux or JVD.     Trachea: Trachea and phonation normal. No tracheal deviation.  Cardiovascular:     Rate and Rhythm: Normal rate and regular rhythm.     Chest Wall: PMI is not displaced.     Pulses: Normal pulses.     Heart sounds: Normal heart sounds, S1 normal and S2 normal. No murmur heard. No systolic murmur is present.  No diastolic murmur is present.    No friction rub. No gallop. No S3 or S4 sounds.  Pulmonary:     Effort: Pulmonary effort is normal. No respiratory distress.     Breath sounds: Normal breath sounds. No decreased breath sounds, wheezing, rhonchi or rales.  Chest:     Chest wall: No tenderness.  Breasts:    Right: Normal. No mass.     Left: Normal. No mass.  Abdominal:     General: Bowel sounds are normal.     Palpations: Abdomen is soft. There is no hepatomegaly, splenomegaly or mass.     Tenderness: There is no abdominal tenderness. There is no guarding or rebound.   Genitourinary:    Penis: Normal.      Testes: Normal.        Right: Mass, tenderness or swelling not present.        Left: Mass, tenderness or swelling not present.     Prostate: Normal. Not enlarged, not tender and no nodules present.     Rectum: Normal. Guaiac result negative. No  mass.  Musculoskeletal:        General: No tenderness. Normal range of motion.     Cervical back: Normal, full passive range of motion without pain, normal range of motion and neck supple.     Thoracic back: Normal.     Lumbar back: Normal.  Lymphadenopathy:     Head:     Right side of head: No submental or submandibular adenopathy.     Left side of head: No submental or submandibular adenopathy.     Cervical: No cervical adenopathy.     Right cervical: No superficial, deep or posterior cervical adenopathy.    Left cervical: No superficial, deep or posterior cervical adenopathy.     Upper Body:     Right upper body: No supraclavicular or axillary adenopathy.     Left upper body: No supraclavicular or axillary adenopathy.  Skin:    General: Skin is warm.     Capillary Refill: Capillary refill takes less than 2 seconds.     Findings: No rash.  Neurological:     Mental Status: He is alert and oriented to person, place, and time.     Cranial Nerves: Cranial nerves 2-12 are intact. No cranial nerve deficit.     Sensory: Sensation is intact.     Motor: Motor function is intact.     Coordination: Romberg sign negative.     Deep Tendon Reflexes: Reflexes are normal and symmetric.     Reflex Scores:      Tricep reflexes are 2+ on the right side and 2+ on the left side.      Bicep reflexes are 2+ on the right side and 2+ on the left side.      Brachioradialis reflexes are 2+ on the right side and 2+ on the left side.      Patellar reflexes are 2+ on the right side and 2+ on the left side.      Achilles reflexes are 2+ on the right side and 2+ on the left side. Psychiatric:        Behavior: Behavior is  cooperative.    Wt Readings from Last 3 Encounters:  05/28/21 235 lb (106.6 kg)  03/19/21 244 lb (110.7 kg)  02/04/21 249 lb (112.9 kg)    BP 120/70   Pulse 80   Ht 6' 4.5" (1.943 m)   Wt 235 lb (106.6 kg)   BMI 28.23 kg/m   Assessment and Plan: Patient is a 41year old male who presents for a comprehensive physical exam. The patient reports the following problems: None. Health maintenance has been reviewed up-to-date.Hines Carraher Bentley is a 41 y.o. male who presents today for his Complete Annual Exam. He feels well. He reports exercising at the gym. He reports he is sleeping well.    1. Annual physical exam Immunizations are reviewed and recommendations provided.   Age appropriate screening tests are discussed. Counseling given for risk factor reduction interventions.  No subjective/objective concerns noted during history of present illness past medical history, review of systems, and physical exam. - Renal Function Panel  2. Thrombocytopenia (HCC) Patient has a history of low platelets and we will check CBC with differential platelets for current status. - CBC with Differential/Platelet  3. Familial hypercholesterolemia Patient has had elevated LDL/triglycerides and we will check lipid panel for current status. - Lipid Panel With LDL/HDL Ratio  4. Infertility male Patient has had a history of infertility and will we will check testosterone free and total for current  status. - Testosterone,Free and Total  5. Colon cancer screening DRE is normal with Hemoccult negative - POCT Occult Blood Stool

## 2021-06-03 ENCOUNTER — Other Ambulatory Visit: Payer: Self-pay | Admitting: *Deleted

## 2021-06-03 DIAGNOSIS — D696 Thrombocytopenia, unspecified: Secondary | ICD-10-CM

## 2021-06-03 LAB — CBC WITH DIFFERENTIAL/PLATELET
Basophils Absolute: 0 10*3/uL (ref 0.0–0.2)
Basos: 1 %
EOS (ABSOLUTE): 0.3 10*3/uL (ref 0.0–0.4)
Eos: 4 %
Hematocrit: 43.4 % (ref 37.5–51.0)
Hemoglobin: 15 g/dL (ref 13.0–17.7)
Immature Grans (Abs): 0 10*3/uL (ref 0.0–0.1)
Immature Granulocytes: 0 %
Lymphocytes Absolute: 2.4 10*3/uL (ref 0.7–3.1)
Lymphs: 35 %
MCH: 32.8 pg (ref 26.6–33.0)
MCHC: 34.6 g/dL (ref 31.5–35.7)
MCV: 95 fL (ref 79–97)
Monocytes Absolute: 0.7 10*3/uL (ref 0.1–0.9)
Monocytes: 10 %
Neutrophils Absolute: 3.5 10*3/uL (ref 1.4–7.0)
Neutrophils: 50 %
Platelets: 161 10*3/uL (ref 150–450)
RBC: 4.58 x10E6/uL (ref 4.14–5.80)
RDW: 12.3 % (ref 11.6–15.4)
WBC: 6.9 10*3/uL (ref 3.4–10.8)

## 2021-06-03 LAB — RENAL FUNCTION PANEL
Albumin: 5 g/dL (ref 4.0–5.0)
BUN/Creatinine Ratio: 14 (ref 9–20)
BUN: 14 mg/dL (ref 6–24)
CO2: 26 mmol/L (ref 20–29)
Calcium: 9.7 mg/dL (ref 8.7–10.2)
Chloride: 101 mmol/L (ref 96–106)
Creatinine, Ser: 1.03 mg/dL (ref 0.76–1.27)
Glucose: 90 mg/dL (ref 70–99)
Phosphorus: 2.9 mg/dL (ref 2.8–4.1)
Potassium: 4.2 mmol/L (ref 3.5–5.2)
Sodium: 139 mmol/L (ref 134–144)
eGFR: 93 mL/min/{1.73_m2} (ref >59)

## 2021-06-03 LAB — LIPID PANEL WITH LDL/HDL RATIO
Cholesterol, Total: 172 mg/dL (ref 100–199)
HDL: 41 mg/dL (ref >39)
LDL Chol Calc (NIH): 102 mg/dL — ABNORMAL HIGH (ref 0–99)
LDL/HDL Ratio: 2.5 ratio (ref 0.0–3.6)
Triglycerides: 164 mg/dL — ABNORMAL HIGH (ref 0–149)
VLDL Cholesterol Cal: 29 mg/dL (ref 5–40)

## 2021-06-03 LAB — TESTOSTERONE,FREE AND TOTAL
Testosterone, Free: 8.4 pg/mL (ref 6.8–21.5)
Testosterone: 425 ng/dL (ref 264–916)

## 2021-06-10 ENCOUNTER — Other Ambulatory Visit: Payer: 59

## 2021-06-11 ENCOUNTER — Other Ambulatory Visit: Payer: 59

## 2021-06-17 ENCOUNTER — Inpatient Hospital Stay: Payer: 59 | Attending: Nurse Practitioner | Admitting: Oncology

## 2021-06-17 ENCOUNTER — Encounter: Payer: Self-pay | Admitting: Oncology

## 2022-01-13 ENCOUNTER — Encounter (INDEPENDENT_AMBULATORY_CARE_PROVIDER_SITE_OTHER): Payer: Self-pay

## 2022-05-30 ENCOUNTER — Encounter: Payer: Self-pay | Admitting: Family Medicine

## 2022-05-30 ENCOUNTER — Ambulatory Visit (INDEPENDENT_AMBULATORY_CARE_PROVIDER_SITE_OTHER): Payer: 59 | Admitting: Family Medicine

## 2022-05-30 VITALS — BP 122/78 | HR 70 | Ht 76.5 in | Wt 240.0 lb

## 2022-05-30 DIAGNOSIS — Z0000 Encounter for general adult medical examination without abnormal findings: Secondary | ICD-10-CM

## 2022-05-30 NOTE — Progress Notes (Signed)
Date:  05/30/2022   Name:  Juan Gregory   DOB:  Dec 11, 1980   MRN:  [id number]   Chief Complaint: Annual Exam  Patient is a 42 year old male who presents for a comprehensive physical exam. The patient reports the following problems: none. Health maintenance has been reviewed up to date.      Lab Results  Component Value Date   NA 139 05/28/2021   K 4.2 05/28/2021   CO2 26 05/28/2021   GLUCOSE 90 05/28/2021   BUN 14 05/28/2021   CREATININE 1.03 05/28/2021   CALCIUM 9.7 05/28/2021   EGFR 93 05/28/2021   GFRNONAA 90 05/11/2019   Lab Results  Component Value Date   CHOL 172 05/28/2021   HDL 41 05/28/2021   LDLCALC 102 (H) 05/28/2021   TRIG 164 (H) 05/28/2021   No results found for: "TSH" No results found for: "HGBA1C" Lab Results  Component Value Date   WBC 6.9 05/28/2021   HGB 15.0 05/28/2021   HCT 43.4 05/28/2021   MCV 95 05/28/2021   PLT 161 05/28/2021   Lab Results  Component Value Date   ALT 30 05/23/2020   AST 29 05/23/2020   ALKPHOS 71 05/23/2020   BILITOT 0.4 05/23/2020   No results found for: "25OHVITD2", "25OHVITD3", "VD25OH"   Review of Systems  Constitutional:  Negative for chills and fever.  HENT:  Negative for drooling, ear discharge, ear pain and sore throat.   Respiratory:  Negative for cough, shortness of breath and wheezing.   Cardiovascular:  Negative for chest pain, palpitations and leg swelling.  Gastrointestinal:  Negative for abdominal pain, blood in stool, constipation, diarrhea and nausea.  Endocrine: Negative for polydipsia.  Genitourinary:  Negative for dysuria, frequency, hematuria and urgency.  Musculoskeletal:  Negative for back pain, myalgias and neck pain.  Skin:  Negative for rash.  Allergic/Immunologic: Negative for environmental allergies.  Neurological:  Negative for dizziness and headaches.  Hematological:  Does not bruise/bleed easily.  Psychiatric/Behavioral:  Negative for suicidal ideas. The patient is not  nervous/anxious.     Patient Active Problem List   Diagnosis Date Noted   Impingement syndrome of right shoulder region 05/08/2021   Cervical radiculopathy 05/08/2021   Varicose veins of bilateral lower extremities with pain 12/06/2018   Infertility male 05/30/2013    No Known Allergies  Past Surgical History:  Procedure Laterality Date   VARICOSE VEIN SURGERY Bilateral     Social History   Tobacco Use   Smoking status: Never   Smokeless tobacco: Current    Types: Snuff  Substance Use Topics   Alcohol use: Yes    Alcohol/week: 0.0 standard drinks of alcohol   Drug use: No     Medication list has been reviewed and updated.  Current Meds  Medication Sig   carbamide peroxide (DEBROX) 6.5 % OTIC solution Place 5 drops into both ears 2 (two) times daily.   Multiple Vitamin (MULTIVITAMIN) tablet Take 1 tablet by mouth daily.       05/30/2022    8:29 AM 05/28/2021    8:51 AM 08/17/2020    8:40 AM 02/02/2020    3:58 PM  GAD 7 : Generalized Anxiety Score  Nervous, Anxious, on Edge 0 0 0 0  Control/stop worrying 0 0 0 0  Worry too much - different things 0 0 0 0  Trouble relaxing 0 0 0 0  Restless 0 0 0 0  Easily annoyed or irritable 0 0 0 0  Afraid - awful might happen 0 0 0 0  Total GAD 7 Score 0 0 0 0  Anxiety Difficulty Not difficult at all Not difficult at all         05/30/2022    8:29 AM 05/28/2021    8:51 AM 08/17/2020    8:40 AM  Depression screen PHQ 2/9  Decreased Interest 0 0 0  Down, Depressed, Hopeless 0 0 0  PHQ - 2 Score 0 0 0  Altered sleeping 0 0 0  Tired, decreased energy 0 0 0  Change in appetite 0 0 0  Feeling bad or failure about yourself  0 0 0  Trouble concentrating 0 0 0  Moving slowly or fidgety/restless 0 0 0  Suicidal thoughts 0 0 0  PHQ-9 Score 0 0 0  Difficult doing work/chores Not difficult at all Not difficult at all     BP Readings from Last 3 Encounters:  05/30/22 122/78  05/28/21 120/70  03/19/21 100/62    Physical  Exam Vitals and nursing note reviewed.  Constitutional:      Appearance: Normal appearance. He is well-groomed.  HENT:     Head: Normocephalic.     Jaw: There is normal jaw occlusion.     Right Ear: Hearing, tympanic membrane, ear canal and external ear normal.     Left Ear: Hearing, tympanic membrane, ear canal and external ear normal.     Nose: Nose normal.     Mouth/Throat:     Lips: Pink.     Mouth: Mucous membranes are moist.     Dentition: Normal dentition.     Tongue: No lesions.     Palate: No mass.     Pharynx: Oropharynx is clear.  Eyes:     General: Lids are normal. Vision grossly intact. Gaze aligned appropriately. No scleral icterus.       Right eye: No discharge.        Left eye: No discharge.     Conjunctiva/sclera: Conjunctivae normal.     Pupils: Pupils are equal, round, and reactive to light.     Funduscopic exam:    Right eye: Red reflex present.        Left eye: Red reflex present. Neck:     Thyroid: No thyroid mass, thyromegaly or thyroid tenderness.     Vascular: Normal carotid pulses. No carotid bruit, hepatojugular reflux or JVD.     Trachea: Trachea and phonation normal. No tracheal deviation.  Cardiovascular:     Rate and Rhythm: Normal rate and regular rhythm.     Chest Wall: PMI is not displaced.     Pulses: Normal pulses.          Carotid pulses are 2+ on the right side and 2+ on the left side.      Radial pulses are 2+ on the right side and 2+ on the left side.       Femoral pulses are 2+ on the right side and 2+ on the left side.      Popliteal pulses are 2+ on the right side and 2+ on the left side.       Dorsalis pedis pulses are 2+ on the right side and 2+ on the left side.       Posterior tibial pulses are 2+ on the right side and 2+ on the left side.     Heart sounds: Normal heart sounds, S1 normal and S2 normal. No murmur heard.    No systolic murmur  is present.     No diastolic murmur is present.     No friction rub. No gallop. No S3 or  S4 sounds.  Pulmonary:     Effort: Pulmonary effort is normal. No respiratory distress.     Breath sounds: Normal breath sounds. No decreased breath sounds, wheezing, rhonchi or rales.  Chest:  Breasts:    Breasts are symmetrical.     Right: Normal.     Left: Normal.  Abdominal:     General: Bowel sounds are normal.     Palpations: Abdomen is soft. There is no hepatomegaly, splenomegaly or mass.     Tenderness: There is no abdominal tenderness. There is no right CVA tenderness, left CVA tenderness, guarding or rebound.     Hernia: There is no hernia in the umbilical area or ventral area.  Genitourinary:    Penis: Normal.      Testes: Normal.     Epididymis:     Right: Normal.     Left: Normal.     Prostate: Normal. Not enlarged, not tender and no nodules present.     Rectum: Normal. Guaiac result negative. No mass.  Musculoskeletal:        General: No tenderness. Normal range of motion.     Cervical back: Normal, full passive range of motion without pain, normal range of motion and neck supple.     Thoracic back: Normal.     Lumbar back: Normal.     Right lower leg: No edema.     Left lower leg: No edema.  Lymphadenopathy:     Head:     Right side of head: No submental, submandibular or tonsillar adenopathy.     Left side of head: No submental, submandibular or tonsillar adenopathy.     Cervical: No cervical adenopathy.     Right cervical: No superficial, deep or posterior cervical adenopathy.    Left cervical: No superficial, deep or posterior cervical adenopathy.     Upper Body:     Right upper body: No supraclavicular or axillary adenopathy.     Left upper body: No supraclavicular or axillary adenopathy.     Lower Body: No right inguinal adenopathy. No left inguinal adenopathy.  Skin:    General: Skin is warm.     Capillary Refill: Capillary refill takes less than 2 seconds.     Findings: No rash.  Neurological:     Mental Status: He is alert and oriented to person,  place, and time.     Cranial Nerves: Cranial nerves 2-12 are intact. No cranial nerve deficit.     Sensory: Sensation is intact.     Motor: Motor function is intact.     Coordination: Romberg sign negative.     Deep Tendon Reflexes: Reflexes are normal and symmetric.  Psychiatric:        Behavior: Behavior is cooperative.     Wt Readings from Last 3 Encounters:  05/30/22 240 lb (108.9 kg)  05/28/21 235 lb (106.6 kg)  03/19/21 244 lb (110.7 kg)    BP 122/78   Pulse 70   Ht 6' 4.5" (1.943 m)   Wt 240 lb (108.9 kg)   SpO2 99%   BMI 28.83 kg/m   Assessment and Plan:  Juan Gregory is a 42 y.o. male who presents today for his Complete Annual Exam. He feels well. He reports exercising 3/week. He reports he is sleeping well.Immunizations are reviewed and recommendations provided.   Age appropriate screening tests are  discussed. Counseling given for risk factor reduction interventions.    1. Annual physical exam No subjective/objective concerns noted during HPI, review of past medical history/medications/labs, review of systems, and physical exam.  Will obtain CMP and lipid.  CBCs are monitored by oncology for his thrombocytopenia. - Comprehensive metabolic panel - Lipid Panel With LDL/HDL Ratio  Otilio Miu, MD

## 2022-05-31 LAB — COMPREHENSIVE METABOLIC PANEL
ALT: 24 IU/L (ref 0–44)
AST: 24 IU/L (ref 0–40)
Albumin/Globulin Ratio: 2 (ref 1.2–2.2)
Albumin: 4.9 g/dL (ref 4.1–5.1)
Alkaline Phosphatase: 66 IU/L (ref 44–121)
BUN/Creatinine Ratio: 13 (ref 9–20)
BUN: 12 mg/dL (ref 6–24)
Bilirubin Total: 0.7 mg/dL (ref 0.0–1.2)
CO2: 20 mmol/L (ref 20–29)
Calcium: 9.8 mg/dL (ref 8.7–10.2)
Chloride: 105 mmol/L (ref 96–106)
Creatinine, Ser: 0.96 mg/dL (ref 0.76–1.27)
Globulin, Total: 2.4 g/dL (ref 1.5–4.5)
Glucose: 95 mg/dL (ref 70–99)
Potassium: 4.7 mmol/L (ref 3.5–5.2)
Sodium: 143 mmol/L (ref 134–144)
Total Protein: 7.3 g/dL (ref 6.0–8.5)
eGFR: 101 mL/min/{1.73_m2} (ref >59)

## 2022-05-31 LAB — LIPID PANEL WITH LDL/HDL RATIO
Cholesterol, Total: 184 mg/dL (ref 100–199)
HDL: 48 mg/dL (ref >39)
LDL Chol Calc (NIH): 125 mg/dL — ABNORMAL HIGH (ref 0–99)
LDL/HDL Ratio: 2.6 ratio (ref 0.0–3.6)
Triglycerides: 58 mg/dL (ref 0–149)
VLDL Cholesterol Cal: 11 mg/dL (ref 5–40)

## 2022-08-13 ENCOUNTER — Encounter: Payer: Self-pay | Admitting: Family Medicine

## 2022-08-13 ENCOUNTER — Ambulatory Visit (INDEPENDENT_AMBULATORY_CARE_PROVIDER_SITE_OTHER): Payer: 59 | Admitting: Family Medicine

## 2022-08-13 VITALS — BP 118/64 | HR 88 | Ht 76.5 in | Wt 230.0 lb

## 2022-08-13 DIAGNOSIS — J0100 Acute maxillary sinusitis, unspecified: Secondary | ICD-10-CM

## 2022-08-13 MED ORDER — AMOXICILLIN 500 MG PO CAPS: 500.0000 mg | ORAL_CAPSULE | Freq: Three times a day (TID) | ORAL | 0 refills | Status: AC

## 2022-08-13 NOTE — Progress Notes (Signed)
Date:  08/13/2022   Name:  Juan Gregory   DOB:  [birth year]   MRN:  [id number]   Chief Complaint: Sinusitis (Started a week ago with stuffiness and coughing- some clear production when coughing. Nasal congestion and production- yellowish)  Sinusitis This is a new problem. The current episode started in the past 7 days. The problem is unchanged. There has been no fever. Associated symptoms include congestion, coughing and sinus pressure. Pertinent negatives include no chills, diaphoresis, ear pain, headaches, shortness of breath or sore throat. (Malar dyscomfort) Past treatments include nothing.    Lab Results  Component Value Date   NA 143 05/30/2022   K 4.7 05/30/2022   CO2 20 05/30/2022   GLUCOSE 95 05/30/2022   BUN 12 05/30/2022   CREATININE 0.96 05/30/2022   CALCIUM 9.8 05/30/2022   EGFR 101 05/30/2022   GFRNONAA 90 05/11/2019   Lab Results  Component Value Date   CHOL 184 05/30/2022   HDL 48 05/30/2022   LDLCALC 125 (H) 05/30/2022   TRIG 58 05/30/2022   No results found for: "TSH" No results found for: "HGBA1C" Lab Results  Component Value Date   WBC 6.9 05/28/2021   HGB 15.0 05/28/2021   HCT 43.4 05/28/2021   MCV 95 05/28/2021   PLT 161 05/28/2021   Lab Results  Component Value Date   ALT 24 05/30/2022   AST 24 05/30/2022   ALKPHOS 66 05/30/2022   BILITOT 0.7 05/30/2022   No results found for: "25OHVITD2", "25OHVITD3", "VD25OH"   Review of Systems  Constitutional:  Negative for chills and diaphoresis.  HENT:  Positive for congestion and sinus pressure. Negative for ear pain and sore throat.   Eyes:  Negative for visual disturbance.  Respiratory:  Positive for cough. Negative for chest tightness, shortness of breath and wheezing.   Cardiovascular:  Negative for chest pain and palpitations.  Neurological:  Negative for headaches.    Patient Active Problem List   Diagnosis Date Noted   Impingement syndrome of right shoulder region 05/08/2021    Cervical radiculopathy 05/08/2021   Varicose veins of bilateral lower extremities with pain 12/06/2018   Infertility male 05/30/2013    No Known Allergies  Past Surgical History:  Procedure Laterality Date   VARICOSE VEIN SURGERY Bilateral     Social History   Tobacco Use   Smoking status: Never   Smokeless tobacco: Current    Types: Snuff  Substance Use Topics   Alcohol use: Yes    Alcohol/week: 0.0 standard drinks of alcohol   Drug use: No     Medication list has been reviewed and updated.  Current Meds  Medication Sig   carbamide peroxide (DEBROX) 6.5 % OTIC solution Place 5 drops into both ears 2 (two) times daily.   Multiple Vitamin (MULTIVITAMIN) tablet Take 1 tablet by mouth daily.       08/13/2022    9:24 AM 05/30/2022    8:29 AM 05/28/2021    8:51 AM 08/17/2020    8:40 AM  GAD 7 : Generalized Anxiety Score  Nervous, Anxious, on Edge 0 0 0 0  Control/stop worrying 0 0 0 0  Worry too much - different things 0 0 0 0  Trouble relaxing 0 0 0 0  Restless 0 0 0 0  Easily annoyed or irritable 0 0 0 0  Afraid - awful might happen 0 0 0 0  Total GAD 7 Score 0 0 0 0  Anxiety Difficulty Not difficult at  all Not difficult at all Not difficult at all        08/13/2022    9:24 AM 05/30/2022    8:29 AM 05/28/2021    8:51 AM  Depression screen PHQ 2/9  Decreased Interest 0 0 0  Down, Depressed, Hopeless 0 0 0  PHQ - 2 Score 0 0 0  Altered sleeping 0 0 0  Tired, decreased energy 0 0 0  Change in appetite 0 0 0  Feeling bad or failure about yourself  0 0 0  Trouble concentrating 0 0 0  Moving slowly or fidgety/restless 0 0 0  Suicidal thoughts 0 0 0  PHQ-9 Score 0 0 0  Difficult doing work/chores Not difficult at all Not difficult at all Not difficult at all    BP Readings from Last 3 Encounters:  08/13/22 118/64  05/30/22 122/78  05/28/21 120/70    Physical Exam Vitals and nursing note reviewed.  HENT:     Head: Normocephalic.     Salivary Glands: Right  salivary gland is not diffusely enlarged or tender. Left salivary gland is not diffusely enlarged or tender.     Right Ear: External ear normal.     Left Ear: External ear normal.     Nose: Nose normal.     Right Sinus: No maxillary sinus tenderness or frontal sinus tenderness.     Left Sinus: No maxillary sinus tenderness or frontal sinus tenderness.  Eyes:     General: No scleral icterus.       Right eye: No discharge.        Left eye: No discharge.     Conjunctiva/sclera: Conjunctivae normal.     Pupils: Pupils are equal, round, and reactive to light.  Neck:     Thyroid: No thyromegaly.     Vascular: No JVD.     Trachea: No tracheal deviation.  Cardiovascular:     Rate and Rhythm: Normal rate and regular rhythm.     Heart sounds: Normal heart sounds. No murmur heard.    No friction rub. No gallop.  Pulmonary:     Effort: No respiratory distress.     Breath sounds: Normal breath sounds. No wheezing, rhonchi or rales.  Abdominal:     General: Bowel sounds are normal.     Palpations: Abdomen is soft. There is no mass.     Tenderness: There is no abdominal tenderness. There is no guarding or rebound.  Musculoskeletal:        General: No tenderness. Normal range of motion.     Cervical back: Normal range of motion and neck supple.  Lymphadenopathy:     Cervical: No cervical adenopathy.  Skin:    General: Skin is warm.     Findings: No rash.  Neurological:     Mental Status: He is alert and oriented to person, place, and time.     Cranial Nerves: No cranial nerve deficit.     Wt Readings from Last 3 Encounters:  08/13/22 230 lb (104.3 kg)  05/30/22 240 lb (108.9 kg)  05/28/21 235 lb (106.6 kg)    BP 118/64   Pulse 88   Ht 6' 4.5" (1.943 m)   Wt 230 lb (104.3 kg)   SpO2 96%   BMI 27.63 kg/m   Assessment and Plan: 1. Acute non-recurrent maxillary sinusitis Acute.  Persistent.  History and examination is consistent with an acute maxillary sinusitis.  Will treat with  amoxicillin 500 mg 3 times - amoxicillin (AMOXIL) 500  MG capsule; Take 1 capsule (500 mg total) by mouth 3 (three) times daily for 10 days.  Dispense: 30 capsule; Refill: 0     Elizabeth Sauer, MD

## 2022-10-31 ENCOUNTER — Encounter: Payer: Self-pay | Admitting: Family Medicine

## 2022-10-31 ENCOUNTER — Ambulatory Visit (INDEPENDENT_AMBULATORY_CARE_PROVIDER_SITE_OTHER): Payer: 59 | Admitting: Family Medicine

## 2022-10-31 VITALS — BP 120/78 | HR 106 | Ht 76.5 in | Wt 234.0 lb

## 2022-10-31 DIAGNOSIS — M79671 Pain in right foot: Secondary | ICD-10-CM

## 2022-10-31 MED ORDER — DICLOFENAC SODIUM 75 MG PO TBEC: 75.0000 mg | DELAYED_RELEASE_TABLET | Freq: Two times a day (BID) | ORAL | 0 refills | Status: DC

## 2022-11-03 DIAGNOSIS — M79671 Pain in right foot: Secondary | ICD-10-CM | POA: Insufficient documentation

## 2022-11-03 NOTE — Patient Instructions (Signed)
Plan: - Dose diclofenac twice daily x 1-2 weeks (take with food) then as-needed - Consider arch supports - Contact if still symptomatic to discuss next steps

## 2022-11-03 NOTE — Progress Notes (Signed)
     Primary Care / Sports Medicine Office Visit  Patient Information:  Patient ID: Juan Gregory, male DOB: 12/27/1980 Age: 42 y.o. MRN: [id number]   Juan Gregory is a pleasant 42 y.o. male presenting with the following:  Chief Complaint  Patient presents with   Foot Pain    Right 2 weeks, unknown injurty, top    Vitals:   10/31/22 1553  BP: 120/78  Pulse: (!) 106  SpO2: 95%   Vitals:   10/31/22 1553  Weight: 234 lb (106.1 kg)  Height: 6' 4.5" (1.943 m)   Body mass index is 28.11 kg/m.  No results found.   Independent interpretation of notes and tests performed by another provider:   None  Procedures performed:   None  Pertinent History, Exam, Impression, and Recommendations:   Juan Gregory was seen today for foot pain.  Pain of midfoot, right Assessment & Plan: Acute on chronic, atraumatic in onset, in the setting of high-demand work activity and need for steel-toe boots. Progressive pain that is worst in AM, eases somewhat with motion, and with stiffness after immobility, worsens towards end of day. OTC treatments with limited response.  Examination with significant pes cavus, no swelling, erythema, tenderness about the midfoot MTP region medial midfoot, negative provocative test.  Given chronicity and clinical features, most likely mild degenerative changes underlying etiology.  Plan: - Dose diclofenac twice daily x 1-2 weeks (take with food) then as-needed - Consider arch supports - Contact if still symptomatic to discuss next steps - Next steps to include x-ray foot, consideration of shutdown / immobilization, escalation of pharmacotherapy   Other orders -     Diclofenac Sodium; Take 1 tablet (75 mg total) by mouth 2 (two) times daily.  Dispense: 60 tablet; Refill: 0     Orders & Medications Meds ordered this encounter  Medications   diclofenac (VOLTAREN) 75 MG EC tablet    Sig: Take 1 tablet (75 mg total) by mouth 2 (two) times daily.     Dispense:  60 tablet    Refill:  0   No orders of the defined types were placed in this encounter.    No follow-ups on file.     Jerrol Banana, MD, [facility]   Primary Care Sports Medicine Primary Care and Sports Medicine at [facility]

## 2022-11-03 NOTE — Assessment & Plan Note (Signed)
Acute on chronic, atraumatic in onset, in the setting of high-demand work activity and need for steel-toe boots. Progressive pain that is worst in AM, eases somewhat with motion, and with stiffness after immobility, worsens towards end of day. OTC treatments with limited response.  Examination with significant pes cavus, no swelling, erythema, tenderness about the midfoot MTP region medial midfoot, negative provocative test.  Given chronicity and clinical features, most likely mild degenerative changes underlying etiology.  Plan: - Dose diclofenac twice daily x 1-2 weeks (take with food) then as-needed - Consider arch supports - Contact if still symptomatic to discuss next steps - Next steps to include x-ray foot, consideration of shutdown / immobilization, escalation of pharmacotherapy

## 2022-11-25 ENCOUNTER — Ambulatory Visit: Payer: Self-pay

## 2022-11-25 NOTE — Telephone Encounter (Signed)
Summary: medication issues   Pt's wife called saying the medication he is taking for his foot is causing him to have diarrhea.  She does not know the name of the medication.  CB@  [phone]       Called wife's number LMOM to return our call.

## 2022-11-25 NOTE — Telephone Encounter (Signed)
Called pt left him know the changes per Dr. Ashley Royalty. Pt verbalized understanding.  KP

## 2022-11-25 NOTE — Telephone Encounter (Signed)
Message from [place] P sent at 11/25/2022  9:50 AM EDT  Summary: medication issues   Pt's wife called saying the medication he is taking for his foot is causing him to have diarrhea.  She does not know the name of the medication.  CB@  [phone]         Chief Complaint: diarrhea and stomach cramps after taking Diclofenac Symptoms: stopped taking the med and diarrhea stopped then restarted Sat and then stomach cramping Sunday and Monday pt has stopped med. Med was helpful but wwanting to have a different med that will not cause sx Frequency: took med 8/16-8/20 due to dsevere diarrhea and stomach cramping stopped for 2 days then on 8/22 was ok the sx began again on second try to take  Pertinent Negatives: Patient denies diarrhea now or stomach cramps  Disposition: [] ED /[] Urgent Care (no appt availability in office) / [] Appointment(In office/virtual)/ []  [place] [facility]/ [] Home Care/ [] Refused Recommended Disposition /[] [place] [facility]/ [x]  Follow-up with PCP Additional Notes: please advise  Reason for Disposition  [1] Caller has URGENT medicine question about med that PCP or specialist prescribed AND [2] triager unable to answer question  Answer Assessment - Initial Assessment Questions 1. NAME of MEDICINE: "What medicine(s) are you calling about?"     *No Answer* 2. QUESTION: "What is your question?" (e.g., double dose of medicine, side effect)     Side effect  3. PRESCRIBER: "Who prescribed the medicine?" Reason: if prescribed by specialist, call should be referred to that group.     Dr Fulton Reek  4. SYMPTOMS: "Do you have any symptoms?" If Yes, ask: "What symptoms are you having?"  "How bad are the symptoms (e.g., mild, moderate, severe)     Diarrhea severe - stopped taking abx the diarrhea stopped 1 week later and within 2 days diarheaa restarted  5. PREGNANCY:  "Is there any chance that you are pregnant?" "When was your last menstrual period?"     N/a  Protocols  used: Medication Question Call-A-AH

## 2022-11-25 NOTE — Telephone Encounter (Signed)
Please review.  KP

## 2022-11-27 ENCOUNTER — Other Ambulatory Visit: Payer: Self-pay | Admitting: Family Medicine

## 2022-11-27 NOTE — Telephone Encounter (Signed)
Requested Prescriptions  Pending Prescriptions Disp Refills   diclofenac (VOLTAREN) 75 MG EC tablet [Pharmacy Med Name: DICLOFENAC SOD EC 75 MG TAB] 180 tablet 0    Sig: TAKE 1 TABLET BY MOUTH TWICE A DAY     Analgesics:  NSAIDS Failed - 11/27/2022  2:38 AM      Failed - Manual Review: Labs are only required if the patient has taken medication for more than 8 weeks.      Failed - HGB in normal range and within 360 days    Hemoglobin  Date Value Ref Range Status  05/28/2021 15.0 13.0 - 17.7 g/dL Final         Failed - PLT in normal range and within 360 days    Platelets  Date Value Ref Range Status  05/28/2021 161 150 - 450 x10E3/uL Final         Failed - HCT in normal range and within 360 days    Hematocrit  Date Value Ref Range Status  05/28/2021 43.4 37.5 - 51.0 % Final         Passed - Cr in normal range and within 360 days    Creatinine, Ser  Date Value Ref Range Status  05/30/2022 0.96 0.76 - 1.27 mg/dL Final         Passed - eGFR is 30 or above and within 360 days    GFR calc Af Amer  Date Value Ref Range Status  05/11/2019 105 >59 mL/min/1.73 Final   GFR calc non Af Amer  Date Value Ref Range Status  05/11/2019 90 >59 mL/min/1.73 Final   eGFR  Date Value Ref Range Status  05/30/2022 101 >59 mL/min/1.73 Final         Passed - Patient is not pregnant      Passed - Valid encounter within last 12 months    Recent Outpatient Visits           3 weeks ago Pain of midfoot, right   [place] [facility] Ashley Royalty, Ocie Bob, MD   3 months ago Acute non-recurrent maxillary sinusitis   [place] [facility] Inches, MD   6 months ago Annual physical exam   [facility] Inches, MD   1 year ago Annual physical exam   [facility] Inches, MD   1 year ago  Bronchiolitis   [facility] In[facility], MD       Future Appointments             In 6 months Duanne Limerick, MD [facility] Primary Care & Sports Medicine at [facility], [facility]

## 2023-02-20 IMAGING — CR DG CHEST 2V
3 series · 3 of 3 positions shown · non-contrast
Comparison: None.

CLINICAL DATA: Cough and sinus drainage

EXAM:
CHEST - 2 VIEW

[chest pa (1 of 2)]
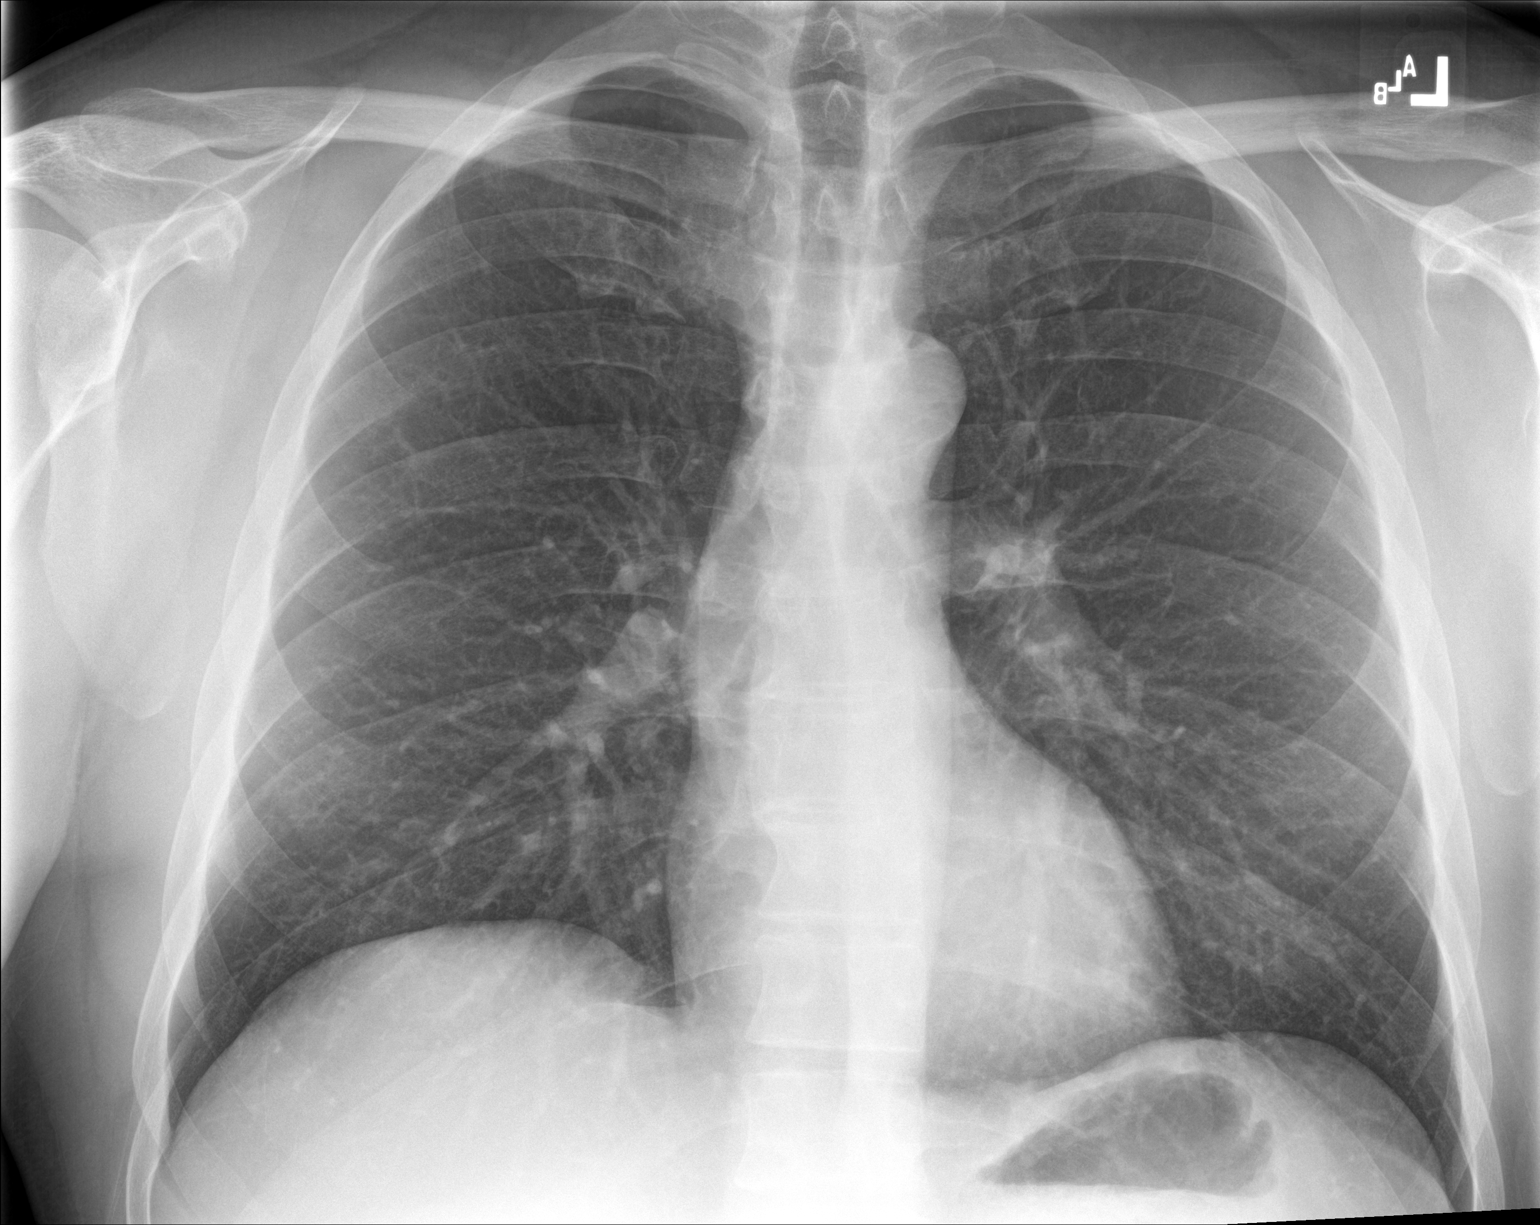

[chest lat]
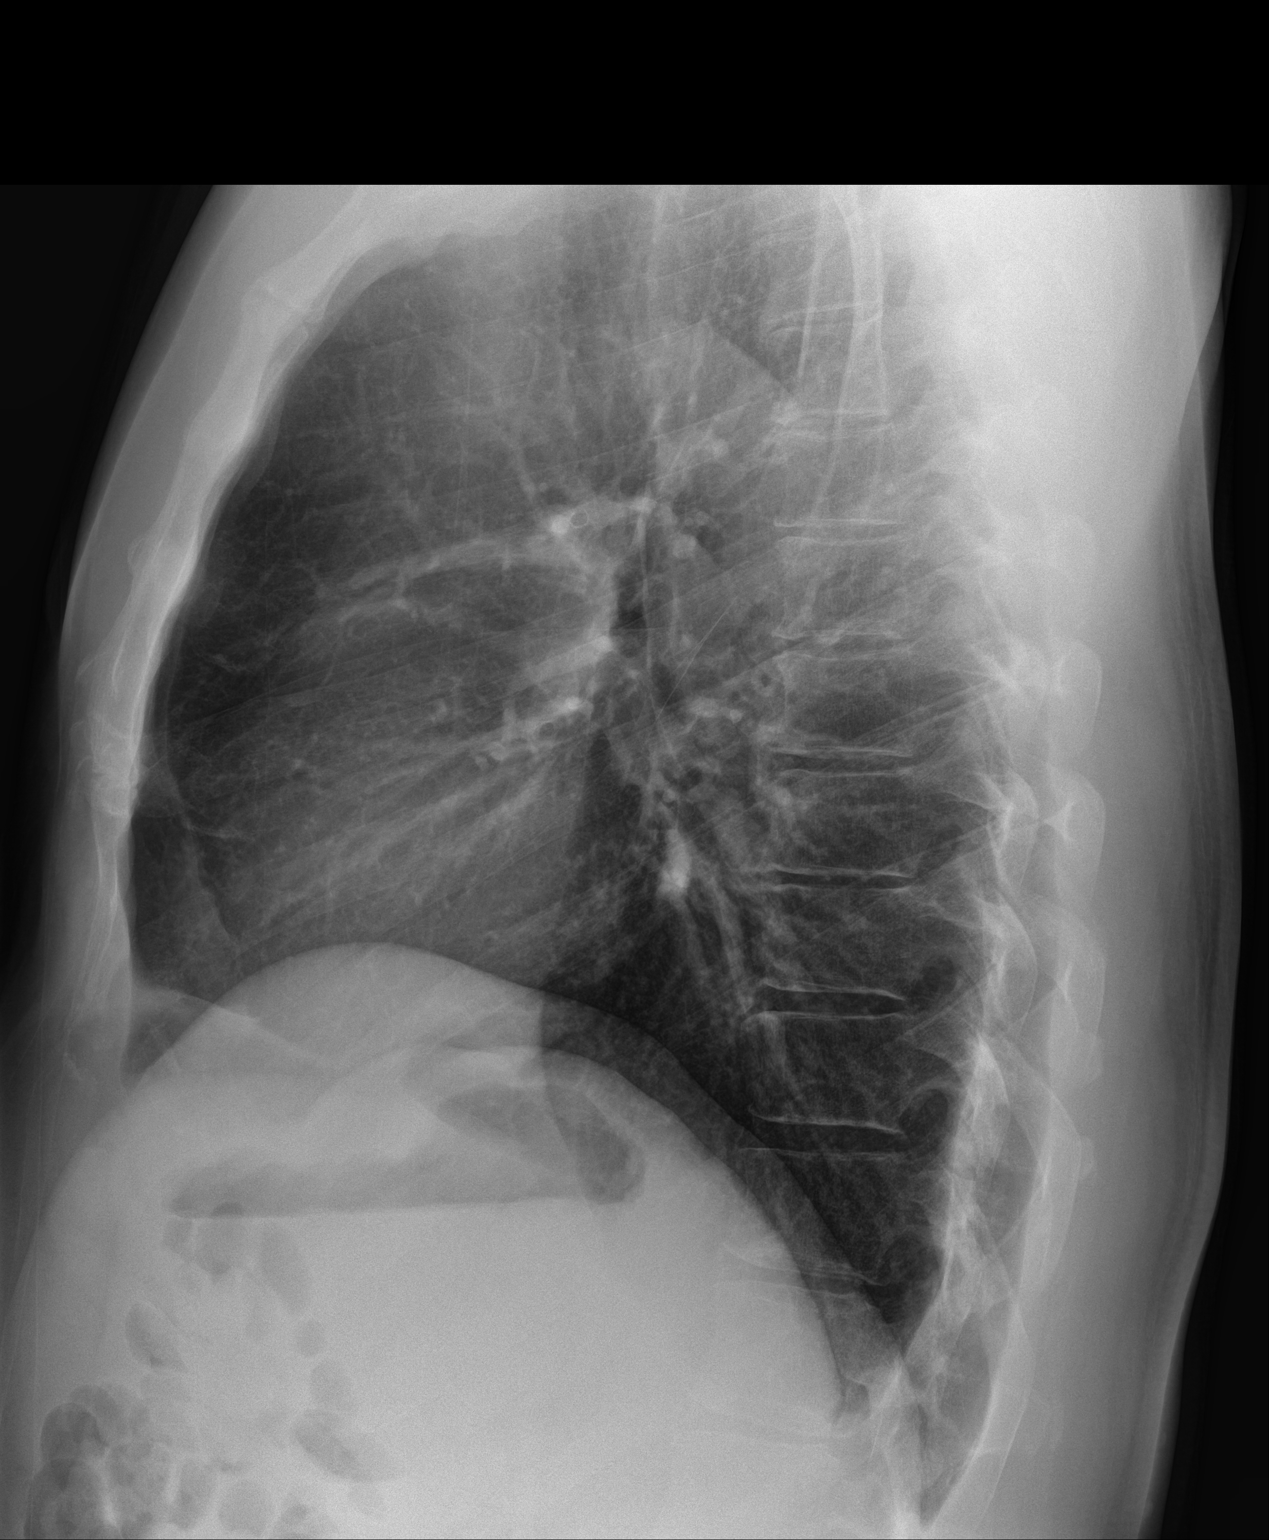

[chest pa (2 of 2)]
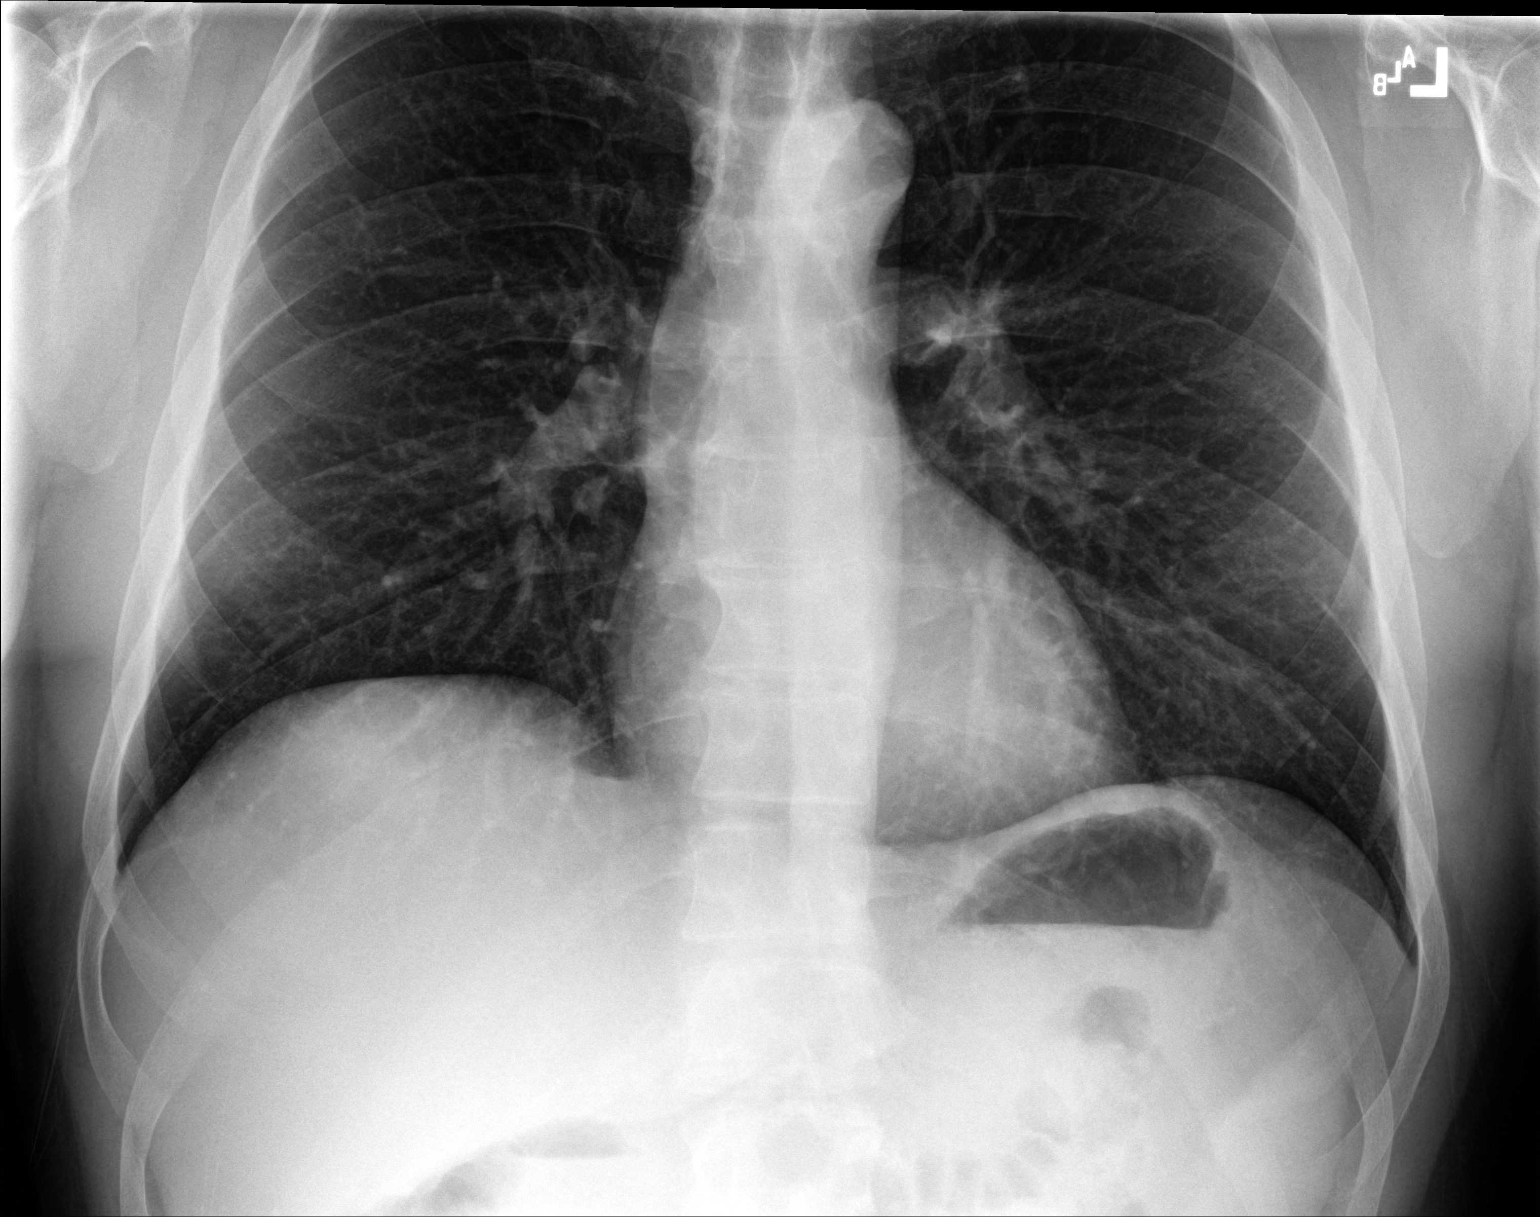

[3 of 3 positions shown; findings below may reference images not displayed]

FINDINGS: The cardiomediastinal silhouette is normal.

There is no focal consolidation or pulmonary edema. There is no
pleural effusion or pneumothorax.

There is no acute osseous abnormality.
IMPRESSION: No radiographic evidence of acute cardiopulmonary process.

## 2023-02-22 ENCOUNTER — Other Ambulatory Visit: Payer: Self-pay | Admitting: Family Medicine

## 2023-04-22 ENCOUNTER — Encounter: Payer: Self-pay | Admitting: Family Medicine

## 2023-04-22 ENCOUNTER — Ambulatory Visit (INDEPENDENT_AMBULATORY_CARE_PROVIDER_SITE_OTHER): Payer: 59 | Admitting: Family Medicine

## 2023-04-22 VITALS — BP 114/80 | HR 86 | Ht 76.5 in | Wt 236.0 lb

## 2023-04-22 DIAGNOSIS — M7541 Impingement syndrome of right shoulder: Secondary | ICD-10-CM

## 2023-04-23 MED ORDER — MELOXICAM 15 MG PO TABS: 15.0000 mg | ORAL_TABLET | Freq: Every day | ORAL | 1 refills | Status: DC

## 2023-04-23 NOTE — Progress Notes (Signed)
 Primary Care / Sports Medicine Office Visit  Patient Information:  Patient ID: Juan Gregory, male DOB: May 03, 1980 Age: 43 y.o. MRN: [id number]   Juan Gregory is a pleasant 43 y.o. male presenting with the following:  Chief Complaint  Patient presents with   Shoulder Pain    Right shoulder pain x 2-3 weeks. Has improved since it started to bother him. Over head reaching send sharp pain and make his arm and hand tingle and numb. NKI. Patient does go to the gym. Patient took tylenol  and IBU for pain. He has had this happened before and was told he has arthritis by previous doctor and bone spurs.     Vitals:   04/22/23 0937  BP: 114/80  Pulse: 86  SpO2: 99%   Vitals:   04/22/23 0937  Weight: 236 lb (107 kg)  Height: 6' 4.5 (1.943 m)   Body mass index is 28.35 kg/m.  No results found.   Independent interpretation of notes and tests performed by another provider:   None  Procedures performed:   None  Pertinent History, Exam, Impression, and Recommendations:   Problem List Items Addressed This Visit     Impingement syndrome of right shoulder region - Primary   History of Present Illness Juan Gregory Lady is a 43 year old male with stated right shoulder arthritis who presents with right acute on chronic shoulder pain and weakness.  He has been experiencing right shoulder pain and weakness for the past two to three weeks. The pain is sharp. This issue has occurred before, and he was previously diagnosed with bone spurs and arthritis in his shoulder at outside orthopedic group.   The pain occurs during activities such as reaching over and lifting heavier weights during workouts. He did not sustain any specific injury but noticed the pain while lifting heavier weights. The pain is more pronounced at the front of the right shoulder and radiates down the back of his triceps.  He has experienced a significant decrease in strength on the right side, particularly when  performing bench presses. Previously, he could lift 225 pounds, but now he struggles with 135 to 145 pounds on the right side. This weakness has persisted for six months or longer.  Patient reports that his prior x-rays revealed small arthritis and bone spurs . He has tried medications and physical therapy, including exercises prescribed for the gym, which have provided some relief. However, he has not returned to his previous level of activity due to fear of re-injury.  He is currently managing the condition with Tylenol  and ibuprofen, noting some improvement. He has adjusted his workout routine to lighter weights with more repetitions to maintain conditioning without exacerbating the pain.  Physical Exam MUSCULOSKELETAL: Tenderness at right acromioclavicular joint and with cross-body provocative testing, equivocal/positive. Positive empty can test on the right.  There is 4/5 supraspinatus strength associate with pain and weakness.  5/5 strength with external rotation, minor discomfort noted. Internal rotation benign 5/5 strength, painless. Negative Neer test. Negative Hawkins test. Negative Speed's test. Equivocal Yergason's test. Equivocal-positive O'Brien test.  Assessment and Plan Right Shoulder impingement Pain and weakness in the right shoulder with a history of arthritis and bone spurs.  Focality to supraspinatus with concern for tearing, shared with patient. Tenderness at the right [facility] joint.  Lastly, equivocal positive O'Brien test suggesting possible labral pathology. -Start anti-inflammatory (Meloxicam ) for two weeks, then as needed.  Take with food -Pause upper body workouts for 3 weeks.  Recommend continued hiatus on upper body workouts other than rehab provided until symptom-free. -Start specific shoulder exercises from the [employer], focusing on stretching and strengthening.  See below for link. -Gradually reintroduce upper body workouts from week three,  starting with reduced weights. -Consider MRI if no improvement in 2-4 weeks.  Contact us  via MyChart/phone call.      Relevant Medications   meloxicam  (MOBIC ) 15 MG tablet     Orders & Medications Medications:  Meds ordered this encounter  Medications   meloxicam  (MOBIC ) 15 MG tablet    Sig: Take 1 tablet (15 mg total) by mouth daily.    Dispense:  30 tablet    Refill:  1   No orders of the defined types were placed in this encounter.    No follow-ups on file.     Selinda JINNY Ku, MD, [facility]   Primary Care Sports Medicine Primary Care and Sports Medicine at [facility]

## 2023-04-23 NOTE — Patient Instructions (Signed)
 Here's the management plan for your right shoulder impingement:  1. Start taking Meloxicam  as an anti-inflammatory for two weeks, and then as needed. Be sure to take it with food to minimize stomach upset. 2. Pause upper body workouts for 3 weeks. It's recommended to continue avoiding upper body workouts, except for rehabilitation exercises, until you are symptom-free. 3. Begin specific shoulder exercises from the [employer], focusing on stretching and strengthening. (You can find the exercises at the provided link.) 4. Gradually reintroduce upper body workouts starting in week three, using reduced weights to prevent further injury. 5. Consider an MRI if there is no improvement in 2-4 weeks. Please contact us  via MyChart or phone call if you need further assistance or to discuss this option.  Please let us  know if you have any questions or need further guidance on your treatment plan!

## 2023-04-23 NOTE — Assessment & Plan Note (Signed)
 History of Present Illness Juan Gregory is a 43 year old male with stated right shoulder arthritis who presents with right acute on chronic shoulder pain and weakness.  He has been experiencing right shoulder pain and weakness for the past two to three weeks. The pain is sharp. This issue has occurred before, and he was previously diagnosed with bone spurs and arthritis in his shoulder at outside orthopedic group.   The pain occurs during activities such as reaching over and lifting heavier weights during workouts. He did not sustain any specific injury but noticed the pain while lifting heavier weights. The pain is more pronounced at the front of the right shoulder and radiates down the back of his triceps.  He has experienced a significant decrease in strength on the right side, particularly when performing bench presses. Previously, he could lift 225 pounds, but now he struggles with 135 to 145 pounds on the right side. This weakness has persisted for six months or longer.  Patient reports that his prior x-rays revealed small arthritis and bone spurs . He has tried medications and physical therapy, including exercises prescribed for the gym, which have provided some relief. However, he has not returned to his previous level of activity due to fear of re-injury.  He is currently managing the condition with Tylenol  and ibuprofen, noting some improvement. He has adjusted his workout routine to lighter weights with more repetitions to maintain conditioning without exacerbating the pain.  Physical Exam MUSCULOSKELETAL: Tenderness at right acromioclavicular joint and with cross-body provocative testing, equivocal/positive. Positive empty can test on the right.  There is 4/5 supraspinatus strength associate with pain and weakness.  5/5 strength with external rotation, minor discomfort noted. Internal rotation benign 5/5 strength, painless. Negative Neer test. Negative Hawkins test. Negative Speed's  test. Equivocal Yergason's test. Equivocal-positive O'Brien test.  Assessment and Plan Right Shoulder impingement Pain and weakness in the right shoulder with a history of arthritis and bone spurs.  Focality to supraspinatus with concern for tearing, shared with patient. Tenderness at the right [facility] joint.  Lastly, equivocal positive O'Brien test suggesting possible labral pathology. -Start anti-inflammatory (Meloxicam ) for two weeks, then as needed.  Take with food -Pause upper body workouts for 3 weeks.  Recommend continued hiatus on upper body workouts other than rehab provided until symptom-free. -Start specific shoulder exercises from the [employer], focusing on stretching and strengthening.  See below for link. -Gradually reintroduce upper body workouts from week three, starting with reduced weights. -Consider MRI if no improvement in 2-4 weeks.  Contact us  via MyChart/phone call.

## 2023-05-19 ENCOUNTER — Other Ambulatory Visit: Payer: Self-pay | Admitting: Family Medicine

## 2023-06-01 ENCOUNTER — Encounter: Payer: 59 | Admitting: Family Medicine

## 2023-06-04 ENCOUNTER — Ambulatory Visit (INDEPENDENT_AMBULATORY_CARE_PROVIDER_SITE_OTHER): Payer: 59 | Admitting: Family Medicine

## 2023-06-04 ENCOUNTER — Encounter: Payer: Self-pay | Admitting: Family Medicine

## 2023-06-04 VITALS — BP 120/84 | HR 69 | Ht 76.5 in | Wt 234.6 lb

## 2023-06-04 DIAGNOSIS — Z0000 Encounter for general adult medical examination without abnormal findings: Secondary | ICD-10-CM

## 2023-06-04 NOTE — Progress Notes (Signed)
 Date:  06/04/2023   Name:  Juan Gregory   DOB:  04/10/1980   MRN:  [id number]   Chief Complaint: Annual Exam (Patient presents today for his annual exam. He is feeling well and does not have any concerns today. )  Patient is a 43 year old male who presents for a comprehensive physical exam. The patient reports the following problems: none. Health maintenance has been reviewed up to date.      Lab Results  Component Value Date   NA 143 05/30/2022   K 4.7 05/30/2022   CO2 20 05/30/2022   GLUCOSE 95 05/30/2022   BUN 12 05/30/2022   CREATININE 0.96 05/30/2022   CALCIUM 9.8 05/30/2022   EGFR 101 05/30/2022   GFRNONAA 90 05/11/2019   Lab Results  Component Value Date   CHOL 184 05/30/2022   HDL 48 05/30/2022   LDLCALC 125 (H) 05/30/2022   TRIG 58 05/30/2022   No results found for: "TSH" No results found for: "HGBA1C" Lab Results  Component Value Date   WBC 6.9 05/28/2021   HGB 15.0 05/28/2021   HCT 43.4 05/28/2021   MCV 95 05/28/2021   PLT 161 05/28/2021   Lab Results  Component Value Date   ALT 24 05/30/2022   AST 24 05/30/2022   ALKPHOS 66 05/30/2022   BILITOT 0.7 05/30/2022   No results found for: "25OHVITD2", "25OHVITD3", "VD25OH"   Review of Systems  Constitutional:  Negative for chills and fever.  HENT:  Negative for drooling, ear discharge, ear pain and sore throat.   Respiratory:  Negative for cough, shortness of breath and wheezing.   Cardiovascular:  Negative for chest pain, palpitations and leg swelling.  Gastrointestinal:  Negative for abdominal pain, blood in stool, constipation, diarrhea and nausea.  Endocrine: Negative for polydipsia.  Genitourinary:  Negative for dysuria, frequency, hematuria and urgency.  Musculoskeletal:  Negative for back pain, myalgias and neck pain.  Skin:  Negative for rash.  Allergic/Immunologic: Negative for environmental allergies.  Neurological:  Negative for dizziness and headaches.  Hematological:  Does not  bruise/bleed easily.  Psychiatric/Behavioral:  Negative for suicidal ideas. The patient is not nervous/anxious.     Patient Active Problem List   Diagnosis Date Noted   Pain of midfoot, right 11/03/2022   Impingement syndrome of right shoulder region 05/08/2021   Cervical radiculopathy 05/08/2021   Varicose veins of bilateral lower extremities with pain 12/06/2018   Infertility male 05/30/2013    No Known Allergies  Past Surgical History:  Procedure Laterality Date   VARICOSE VEIN SURGERY Bilateral     Social History   Tobacco Use   Smoking status: Never   Smokeless tobacco: Current    Types: Snuff  Substance Use Topics   Alcohol use: Yes    Alcohol/week: 0.0 standard drinks of alcohol   Drug use: No     Medication list has been reviewed and updated.  Current Meds  Medication Sig   meloxicam (MOBIC) 15 MG tablet Take 1 tablet (15 mg total) by mouth daily.   Multiple Vitamin (MULTIVITAMIN) tablet Take 1 tablet by mouth daily.       06/04/2023    8:14 AM 08/13/2022    9:24 AM 05/30/2022    8:29 AM 05/28/2021    8:51 AM  GAD 7 : Generalized Anxiety Score  Nervous, Anxious, on Edge 0 0 0 0  Control/stop worrying 0 0 0 0  Worry too much - different things 0 0 0 0  Trouble relaxing 0 0 0 0  Restless 0 0 0 0  Easily annoyed or irritable 0 0 0 0  Afraid - awful might happen 0 0 0 0  Total GAD 7 Score 0 0 0 0  Anxiety Difficulty Not difficult at all Not difficult at all Not difficult at all Not difficult at all       06/04/2023    8:13 AM 08/13/2022    9:24 AM 05/30/2022    8:29 AM  Depression screen PHQ 2/9  Decreased Interest 0 0 0  Down, Depressed, Hopeless 0 0 0  PHQ - 2 Score 0 0 0  Altered sleeping 0 0 0  Tired, decreased energy 0 0 0  Change in appetite 0 0 0  Feeling bad or failure about yourself  0 0 0  Trouble concentrating 0 0 0  Moving slowly or fidgety/restless 0 0 0  Suicidal thoughts 0 0 0  PHQ-9 Score 0 0 0  Difficult doing work/chores Not  difficult at all Not difficult at all Not difficult at all    BP Readings from Last 3 Encounters:  06/04/23 120/84  04/22/23 114/80  10/31/22 120/78    Physical Exam Vitals and nursing note reviewed.  Constitutional:      Appearance: Normal appearance. He is well-developed and well-groomed.  HENT:     Head: Normocephalic and atraumatic.     Jaw: There is normal jaw occlusion.     Right Ear: Tympanic membrane, ear canal and external ear normal.     Left Ear: Tympanic membrane, ear canal and external ear normal.     Nose: Nose normal.     Mouth/Throat:     Lips: Pink.     Mouth: Mucous membranes are moist.     Dentition: Normal dentition.     Tongue: No lesions.     Palate: No mass.     Pharynx: Oropharynx is clear. Uvula midline.  Eyes:     General: Lids are normal. Vision grossly intact. Gaze aligned appropriately. No scleral icterus.    Conjunctiva/sclera: Conjunctivae normal.     Pupils: Pupils are equal, round, and reactive to light.  Neck:     Thyroid: No thyroid mass, thyromegaly or thyroid tenderness.     Vascular: Normal carotid pulses. No carotid bruit, hepatojugular reflux or JVD.     Trachea: Trachea and phonation normal. No tracheal deviation.  Cardiovascular:     Rate and Rhythm: Normal rate and regular rhythm.     Pulses:          Carotid pulses are 2+ on the right side and 2+ on the left side.      Radial pulses are 2+ on the right side and 2+ on the left side.       Femoral pulses are 2+ on the right side and 2+ on the left side.      Popliteal pulses are 2+ on the right side and 2+ on the left side.       Dorsalis pedis pulses are 2+ on the right side and 2+ on the left side.       Posterior tibial pulses are 2+ on the right side and 2+ on the left side.     Heart sounds: Normal heart sounds, S1 normal and S2 normal. No murmur heard.    No systolic murmur is present.     No diastolic murmur is present.     No gallop. No S3 or S4 sounds.  Pulmonary:  Effort: Pulmonary effort is normal. No prolonged expiration.     Breath sounds: Normal breath sounds. No decreased breath sounds, wheezing, rhonchi or rales.  Chest:  Breasts:    Breasts are symmetrical.     Right: Normal. No mass.     Left: Normal. No mass.  Abdominal:     General: Bowel sounds are normal.     Palpations: Abdomen is soft. There is no hepatomegaly, splenomegaly or mass.     Tenderness: There is no abdominal tenderness.     Hernia: There is no hernia in the umbilical area, ventral area or left inguinal area.  Genitourinary:    Penis: Normal and circumcised.      Testes:        Right: Mass not present.        Left: Mass not present.     Epididymis:     Right: Normal.     Left: Normal.  Musculoskeletal:        General: Normal range of motion.     Cervical back: Full passive range of motion without pain, normal range of motion and neck supple.     Right lower leg: No edema.     Left lower leg: No edema.  Lymphadenopathy:     Head:     Right side of head: No submental or submandibular adenopathy.     Left side of head: No submental or submandibular adenopathy.     Cervical: No cervical adenopathy.     Right cervical: No superficial, deep or posterior cervical adenopathy.    Left cervical: No superficial, deep or posterior cervical adenopathy.     Upper Body:     Right upper body: No supraclavicular or axillary adenopathy.     Left upper body: No supraclavicular or axillary adenopathy.  Skin:    General: Skin is warm and dry.     Capillary Refill: Capillary refill takes less than 2 seconds.     Findings: No rash.  Neurological:     Mental Status: He is alert.     Cranial Nerves: Cranial nerves 2-12 are intact.     Sensory: Sensation is intact. No sensory deficit.     Motor: Motor function is intact.     Deep Tendon Reflexes: Reflexes are normal and symmetric.  Psychiatric:        Mood and Affect: Mood is not anxious or depressed.        Behavior: Behavior is  cooperative.     Wt Readings from Last 3 Encounters:  06/04/23 234 lb 9.6 oz (106.4 kg)  04/22/23 236 lb (107 kg)  10/31/22 234 lb (106.1 kg)    BP 120/84   Pulse 69   Ht 6' 4.5" (1.943 m)   Wt 234 lb 9.6 oz (106.4 kg)   SpO2 97%   BMI 28.18 kg/m   Assessment and Plan: Juan Gregory is a 43 y.o. male who presents today for his Complete Annual Exam. He feels well. He reports exercising 2-3x/week. He reports he is sleeping well.  Immunizations are reviewed and recommendations provided.   Age appropriate screening tests are discussed. Counseling given for risk factor reduction interventions.  1. Annual physical exam (Primary) No subjective/objective concerns noted during HPI, review of past medical history/review of medications/review of most recent labs, review of systems and physical exam.  Will check lipid panel and CMP for electrolytes and LDL status.  Will recheck patient with annual physical in 1 year. - Lipid Panel With LDL/HDL Ratio -  Comprehensive metabolic panel    Elizabeth Sauer, MD

## 2023-06-04 NOTE — Patient Instructions (Signed)

## 2023-06-05 ENCOUNTER — Encounter: Payer: Self-pay | Admitting: Family Medicine

## 2023-06-05 LAB — LIPID PANEL WITH LDL/HDL RATIO
Cholesterol, Total: 161 mg/dL (ref 100–199)
HDL: 43 mg/dL (ref >39)
LDL Chol Calc (NIH): 104 mg/dL — ABNORMAL HIGH (ref 0–99)
LDL/HDL Ratio: 2.4 ratio (ref 0.0–3.6)
Triglycerides: 73 mg/dL (ref 0–149)
VLDL Cholesterol Cal: 14 mg/dL (ref 5–40)

## 2023-06-05 LAB — COMPREHENSIVE METABOLIC PANEL
ALT: 19 IU/L (ref 0–44)
AST: 20 IU/L (ref 0–40)
Albumin: 4.6 g/dL (ref 4.1–5.1)
Alkaline Phosphatase: 67 IU/L (ref 44–121)
BUN/Creatinine Ratio: 13 (ref 9–20)
BUN: 13 mg/dL (ref 6–24)
Bilirubin Total: 0.5 mg/dL (ref 0.0–1.2)
CO2: 23 mmol/L (ref 20–29)
Calcium: 9.6 mg/dL (ref 8.7–10.2)
Chloride: 103 mmol/L (ref 96–106)
Creatinine, Ser: 0.99 mg/dL (ref 0.76–1.27)
Globulin, Total: 2.5 g/dL (ref 1.5–4.5)
Glucose: 98 mg/dL (ref 70–99)
Potassium: 4.8 mmol/L (ref 3.5–5.2)
Sodium: 140 mmol/L (ref 134–144)
Total Protein: 7.1 g/dL (ref 6.0–8.5)
eGFR: 96 mL/min/{1.73_m2} (ref >59)

## 2023-06-26 ENCOUNTER — Other Ambulatory Visit: Payer: Self-pay | Admitting: Family Medicine

## 2023-06-26 DIAGNOSIS — M7541 Impingement syndrome of right shoulder: Secondary | ICD-10-CM

## 2023-06-26 NOTE — Telephone Encounter (Signed)
 Requested Prescriptions  Pending Prescriptions Disp Refills   meloxicam (MOBIC) 15 MG tablet [Pharmacy Med Name: MELOXICAM 15 MG TABLET] 90 tablet 0    Sig: TAKE 1 TABLET (15 MG TOTAL) BY MOUTH DAILY.     Analgesics:  COX2 Inhibitors Failed - 06/26/2023  1:50 PM      Failed - Manual Review: Labs are only required if the patient has taken medication for more than 8 weeks.      Failed - HGB in normal range and within 360 days    Hemoglobin  Date Value Ref Range Status  05/28/2021 15.0 13.0 - 17.7 g/dL Final         Failed - HCT in normal range and within 360 days    Hematocrit  Date Value Ref Range Status  05/28/2021 43.4 37.5 - 51.0 % Final         Passed - Cr in normal range and within 360 days    Creatinine, Ser  Date Value Ref Range Status  06/04/2023 0.99 0.76 - 1.27 mg/dL Final         Passed - AST in normal range and within 360 days    AST  Date Value Ref Range Status  06/04/2023 20 0 - 40 IU/L Final         Passed - ALT in normal range and within 360 days    ALT  Date Value Ref Range Status  06/04/2023 19 0 - 44 IU/L Final         Passed - eGFR is 30 or above and within 360 days    GFR calc Af Amer  Date Value Ref Range Status  05/11/2019 105 >59 mL/min/1.73 Final   GFR calc non Af Amer  Date Value Ref Range Status  05/11/2019 90 >59 mL/min/1.73 Final   eGFR  Date Value Ref Range Status  06/04/2023 96 >59 mL/min/1.73 Final         Passed - Patient is not pregnant      Passed - Valid encounter within last 12 months    Recent Outpatient Visits           3 weeks ago Annual physical exam   [facility], MD   2 months ago Impingement syndrome of right shoulder region   [facility] at [facility] [place], Ocie Bob, MD       Future Appointments             In 11 months Quincy Simmonds, MD [facility] at [facility],  [facility]

## 2024-06-06 ENCOUNTER — Encounter: Admitting: Student
